# Patient Record
Sex: Male | Born: 1975 | Race: White | Hispanic: No | Marital: Married | Smoking: Current every day smoker
Health system: Southern US, Community
[De-identification: ages and names within clinical notes are randomized; demographics above are authoritative.]

## PROBLEM LIST (undated history)

## (undated) DIAGNOSIS — I619 Nontraumatic intracerebral hemorrhage, unspecified: Secondary | ICD-10-CM

## (undated) DIAGNOSIS — I10 Essential (primary) hypertension: Secondary | ICD-10-CM

## (undated) HISTORY — DX: Nontraumatic intracerebral hemorrhage, unspecified: I61.9

---

## 2001-02-19 ENCOUNTER — Encounter: Payer: Self-pay | Admitting: Family Medicine

## 2001-02-19 ENCOUNTER — Ambulatory Visit (HOSPITAL_COMMUNITY): Admission: RE | Admit: 2001-02-19 | Discharge: 2001-02-19 | Payer: Self-pay | Admitting: Family Medicine

## 2001-08-17 ENCOUNTER — Encounter: Payer: Self-pay | Admitting: Family Medicine

## 2001-08-17 ENCOUNTER — Ambulatory Visit (HOSPITAL_COMMUNITY): Admission: RE | Admit: 2001-08-17 | Discharge: 2001-08-17 | Payer: Self-pay | Admitting: Family Medicine

## 2001-08-18 ENCOUNTER — Emergency Department (HOSPITAL_COMMUNITY): Admission: EM | Admit: 2001-08-18 | Discharge: 2001-08-18 | Payer: Self-pay | Admitting: Emergency Medicine

## 2001-08-19 ENCOUNTER — Encounter: Payer: Self-pay | Admitting: Emergency Medicine

## 2001-08-20 ENCOUNTER — Observation Stay (HOSPITAL_COMMUNITY): Admission: EM | Admit: 2001-08-20 | Discharge: 2001-08-20 | Payer: Self-pay | Admitting: Emergency Medicine

## 2001-12-29 ENCOUNTER — Emergency Department (HOSPITAL_COMMUNITY): Admission: EM | Admit: 2001-12-29 | Discharge: 2001-12-29 | Payer: Self-pay | Admitting: Emergency Medicine

## 2011-07-31 ENCOUNTER — Emergency Department (HOSPITAL_COMMUNITY): Payer: Self-pay

## 2011-07-31 ENCOUNTER — Emergency Department (HOSPITAL_COMMUNITY)
Admission: EM | Admit: 2011-07-31 | Discharge: 2011-07-31 | Disposition: A | Discharge status: Home / Self Care | Payer: Self-pay | Attending: Emergency Medicine | Admitting: Emergency Medicine

## 2011-07-31 ENCOUNTER — Encounter (HOSPITAL_COMMUNITY): Payer: Self-pay | Admitting: Emergency Medicine

## 2011-07-31 DIAGNOSIS — Y9375 Activity, martial arts: Secondary | ICD-10-CM | POA: Insufficient documentation

## 2011-07-31 DIAGNOSIS — R0789 Other chest pain: Secondary | ICD-10-CM

## 2011-07-31 DIAGNOSIS — R222 Localized swelling, mass and lump, trunk: Secondary | ICD-10-CM | POA: Insufficient documentation

## 2011-07-31 DIAGNOSIS — M549 Dorsalgia, unspecified: Secondary | ICD-10-CM | POA: Insufficient documentation

## 2011-07-31 DIAGNOSIS — R071 Chest pain on breathing: Secondary | ICD-10-CM | POA: Insufficient documentation

## 2011-07-31 DIAGNOSIS — W1801XA Striking against sports equipment with subsequent fall, initial encounter: Secondary | ICD-10-CM | POA: Insufficient documentation

## 2011-07-31 DIAGNOSIS — R05 Cough: Secondary | ICD-10-CM | POA: Insufficient documentation

## 2011-07-31 DIAGNOSIS — R059 Cough, unspecified: Secondary | ICD-10-CM | POA: Insufficient documentation

## 2011-07-31 DIAGNOSIS — S298XXA Other specified injuries of thorax, initial encounter: Secondary | ICD-10-CM | POA: Insufficient documentation

## 2011-07-31 DIAGNOSIS — S20219A Contusion of unspecified front wall of thorax, initial encounter: Secondary | ICD-10-CM | POA: Insufficient documentation

## 2011-07-31 MED ORDER — HYDROCODONE-ACETAMINOPHEN 5-325 MG PO TABS: 1.0000 | ORAL_TABLET | Freq: Four times a day (QID) | ORAL | Status: AC | PRN

## 2011-07-31 MED ORDER — HYDROCODONE-ACETAMINOPHEN 5-325 MG PO TABS
1.0000 | ORAL_TABLET | Freq: Once | ORAL | Status: AC
  Administered 2011-07-31: 1 via ORAL
  Filled 2011-07-31: qty 1

## 2011-07-31 MED ORDER — HYDROCODONE-ACETAMINOPHEN 5-325 MG PO TABS: 1.0000 | ORAL_TABLET | Freq: Four times a day (QID) | ORAL | Status: DC | PRN

## 2011-07-31 NOTE — ED Provider Notes (Signed)
History     CSN: [id number]  Arrival date & time 07/31/11  1209   First MD Initiated Contact with Patient 07/31/11 1318      Chief Complaint  Patient presents with  . Back Pain  . Rib Injury    1 week hx rib pain on l/side. Trauma was sports related    (Consider location/radiation/quality/duration/timing/severity/associated sxs/prior treatment) HPI Comments: Patient states he was doing mixed martial arts 2 weeks ago and fell backwards with someone in a headlock, with the person's head landing on his ribs.  Reports pain, swelling, bruising since then.  Has been using ice, heat, and ibuprofen.  Pain has since gotten worse, he has developed a cough.  Denies fevers, SOB.    Patient is a 36 y.o. male presenting with back pain. The history is provided by the patient.  Back Pain  Associated symptoms include chest pain. Pertinent negatives include no fever and no abdominal pain.    History reviewed. No pertinent past medical history.  History reviewed. No pertinent past surgical history.  History reviewed. No pertinent family history.  History  Substance Use Topics  . Smoking status: Current Everyday Smoker    Types: Cigarettes  . Smokeless tobacco: Not on file  . Alcohol Use: Yes      Review of Systems  Constitutional: Negative for fever.  Respiratory: Positive for cough. Negative for shortness of breath, wheezing and stridor.   Cardiovascular: Positive for chest pain.  Gastrointestinal: Negative for vomiting and abdominal pain.  Musculoskeletal: Positive for back pain.    Allergies  Review of patient's allergies indicates no known allergies.  Home Medications   Current Outpatient Rx  Name Route Sig Dispense Refill  . IBUPROFEN 200 MG PO TABS Oral Take 200 mg by mouth every 6 (six) hours as needed. For pain    . ADULT MULTIVITAMIN W/MINERALS CH Oral Take 1 tablet by mouth daily.      BP 115/66  Pulse 77  Temp(Src) 98.3 F (36.8 C) (Oral)  SpO2 97%  Physical  Exam  Nursing note and vitals reviewed. Constitutional: He is oriented to person, place, and time. He appears well-developed and well-nourished.  HENT:  Head: Normocephalic and atraumatic.  Neck: Neck supple.  Cardiovascular: Normal rate and regular rhythm.   Pulmonary/Chest: Effort normal and breath sounds normal. No respiratory distress. He has no wheezes. He has no rales. He exhibits tenderness.    Neurological: He is alert and oriented to person, place, and time.  Psychiatric: He has a normal mood and affect. His behavior is normal. Judgment and thought content normal.    ED Course  Procedures (including critical care time)  Labs Reviewed - No data to display Dg Ribs Unilateral W/chest Left  07/31/2011  *RADIOLOGY REPORT*  Clinical Data: Injury to left anterior ribs 2 weeks ago, increasing pain, cough  LEFT RIBS AND CHEST - 3+ VIEW  Comparison: None  Findings: Normal heart size, mediastinal contours, and pulmonary vascularity. Lungs clear. No pleural effusion or pneumothorax. No rib fracture or bone destruction identified. Osseous mineralization normal.  IMPRESSION: No acute left rib abnormalities.  Original Report Authenticated By: Lollie Marrow, M.D.     1. Chest wall pain       MDM  Patient is a very athletic, healthy person who sustained an injury to left chest wall while doing mixed martial arts two weeks ago.  From history, it is unclear whether this was a contusion to the chest or a strained muscle (or both).  CXR is negative for fracture and for pneumonia.  Discussed home care and return precautions with patient.  D/C home with pain medications, resources for PCP follow up.  Patient verbalizes understanding and agrees with plan.          Dillard Cannon He[place], [place] 07/31/11 1404

## 2011-07-31 NOTE — Progress Notes (Signed)
ED CM spoke with pt who confirms he is self pay with no pcp. States his job recently dropped his insurance coverage.  CM discussed and provided pt with a list of resources, self pay pcps, health connect and medication assistance resources for [place] self pay residents Pt and family member voiced appreciation and understanding of resources offered.

## 2011-07-31 NOTE — Discharge Instructions (Signed)
Read the information below.  Make sure to take deep breaths and cough throughout the day to prevent pneumonia.  Use the resources provided to find a primary care provider for follow up.  Continue to use the ibuprofen as we discussed.  Do not take additional tylenol while taking the prescribed pain medication.  You may return to the ER at any time for worsening condition or any new symptoms that concern you.   Chest Wall Pain Chest wall pain is pain in or around the bones and muscles of your chest. It may take up to 6 weeks to get better. It may take longer if you must stay physically active in your work and activities.  CAUSES  Chest wall pain may happen on its own. However, it may be caused by:  A viral illness like the flu.   Injury.   Coughing.   Exercise.   Arthritis.   Fibromyalgia.   Shingles.  HOME CARE INSTRUCTIONS   Avoid overtiring physical activity. Try not to strain or perform activities that cause pain. This includes any activities using your chest or your abdominal and side muscles, especially if heavy weights are used.   Put ice on the sore area.   Put ice in a plastic bag.   Place a towel between your skin and the bag.   Leave the ice on for 15 to 20 minutes per hour while awake for the first 2 days.   Only take over-the-counter or prescription medicines for pain, discomfort, or fever as directed by your caregiver.  SEEK IMMEDIATE MEDICAL CARE IF:   Your pain increases, or you are very uncomfortable.   You have a fever.   Your chest pain becomes worse.   You have new, unexplained symptoms.   You have nausea or vomiting.   You feel sweaty or lightheaded.   You have a cough with phlegm (sputum), or you cough up blood.  MAKE SURE YOU:   Understand these instructions.   Will watch your condition.   Will get help right away if you are not doing well or get worse.  Document Released: 04/14/2005 Document Revised: 04/03/2011 Document Reviewed:  12/09/2010 [facility] Patient Information 2012 [place], [place].

## 2011-07-31 NOTE — ED Notes (Signed)
Pt was wrestling at gym and felt a popping sensation on l/rib area

## 2011-08-03 NOTE — ED Provider Notes (Signed)
Medical screening examination/treatment/procedure(s) were performed by non-physician practitioner and as supervising physician I was immediately available for consultation/collaboration.   Gavin Pound. Oletta Lamas, MD 08/03/11 [phone]

## 2017-07-31 ENCOUNTER — Other Ambulatory Visit: Payer: Self-pay

## 2017-07-31 ENCOUNTER — Emergency Department (HOSPITAL_COMMUNITY): Payer: 59

## 2017-07-31 ENCOUNTER — Emergency Department (HOSPITAL_COMMUNITY)
Admission: EM | Admit: 2017-07-31 | Discharge: 2017-07-31 | Disposition: A | Discharge status: Home / Self Care | Payer: 59 | Attending: Emergency Medicine | Admitting: Emergency Medicine

## 2017-07-31 DIAGNOSIS — F17210 Nicotine dependence, cigarettes, uncomplicated: Secondary | ICD-10-CM | POA: Insufficient documentation

## 2017-07-31 DIAGNOSIS — Z79899 Other long term (current) drug therapy: Secondary | ICD-10-CM | POA: Diagnosis not present

## 2017-07-31 DIAGNOSIS — R42 Dizziness and giddiness: Secondary | ICD-10-CM | POA: Diagnosis present

## 2017-07-31 DIAGNOSIS — R569 Unspecified convulsions: Secondary | ICD-10-CM

## 2017-07-31 DIAGNOSIS — G40209 Localization-related (focal) (partial) symptomatic epilepsy and epileptic syndromes with complex partial seizures, not intractable, without status epilepticus: Secondary | ICD-10-CM | POA: Diagnosis not present

## 2017-07-31 LAB — DIFFERENTIAL
BASOS ABS: 0 10*3/uL (ref 0.0–0.1)
BASOS PCT: 0 %
Eosinophils Absolute: 0.1 10*3/uL (ref 0.0–0.7)
Eosinophils Relative: 1 %
Lymphocytes Relative: 14 %
Lymphs Abs: 1.6 10*3/uL (ref 0.7–4.0)
MONO ABS: 0.7 10*3/uL (ref 0.1–1.0)
Monocytes Relative: 7 %
Neutro Abs: 8.7 10*3/uL — ABNORMAL HIGH (ref 1.7–7.7)
Neutrophils Relative %: 78 %

## 2017-07-31 LAB — CBC
HCT: 51.5 % (ref 39.0–52.0)
Hemoglobin: 17.3 g/dL — ABNORMAL HIGH (ref 13.0–17.0)
MCH: 31.1 pg (ref 26.0–34.0)
MCHC: 33.6 g/dL (ref 30.0–36.0)
MCV: 92.6 fL (ref 78.0–100.0)
PLATELETS: 381 10*3/uL (ref 150–400)
RBC: 5.56 MIL/uL (ref 4.22–5.81)
RDW: 14.8 % (ref 11.5–15.5)
WBC: 11.1 10*3/uL — AB (ref 4.0–10.5)

## 2017-07-31 LAB — COMPREHENSIVE METABOLIC PANEL
ALT: 91 U/L — AB (ref 17–63)
AST: 58 U/L — AB (ref 15–41)
Albumin: 3.9 g/dL (ref 3.5–5.0)
Alkaline Phosphatase: 32 U/L — ABNORMAL LOW (ref 38–126)
Anion gap: 7 (ref 5–15)
BUN: 18 mg/dL (ref 6–20)
CHLORIDE: 103 mmol/L (ref 101–111)
CO2: 25 mmol/L (ref 22–32)
CREATININE: 1.27 mg/dL — AB (ref 0.61–1.24)
Calcium: 9.4 mg/dL (ref 8.9–10.3)
GFR calc non Af Amer: >60 mL/min (ref >60)
Glucose, Bld: 107 mg/dL — ABNORMAL HIGH (ref 65–99)
POTASSIUM: 4.6 mmol/L (ref 3.5–5.1)
SODIUM: 135 mmol/L (ref 135–145)
Total Bilirubin: 1.1 mg/dL (ref 0.3–1.2)
Total Protein: 6.9 g/dL (ref 6.5–8.1)

## 2017-07-31 LAB — I-STAT CHEM 8, ED
BUN: 19 mg/dL (ref 6–20)
CHLORIDE: 103 mmol/L (ref 101–111)
Calcium, Ion: 1.18 mmol/L (ref 1.15–1.40)
Creatinine, Ser: 1.3 mg/dL — ABNORMAL HIGH (ref 0.61–1.24)
GLUCOSE: 106 mg/dL — AB (ref 65–99)
HEMATOCRIT: 54 % — AB (ref 39.0–52.0)
Hemoglobin: 18.4 g/dL — ABNORMAL HIGH (ref 13.0–17.0)
POTASSIUM: 4.7 mmol/L (ref 3.5–5.1)
Sodium: 140 mmol/L (ref 135–145)
TCO2: 27 mmol/L (ref 22–32)

## 2017-07-31 LAB — PROTIME-INR
INR: 1
PROTHROMBIN TIME: 13.1 s (ref 11.4–15.2)

## 2017-07-31 LAB — APTT: APTT: 27 s (ref 24–36)

## 2017-07-31 LAB — I-STAT TROPONIN, ED: Troponin i, poc: 0 ng/mL (ref 0.00–0.08)

## 2017-07-31 LAB — CBG MONITORING, ED: GLUCOSE-CAPILLARY: 107 mg/dL — AB (ref 65–99)

## 2017-07-31 MED ORDER — LEVETIRACETAM 500 MG PO TABS: 500.0000 mg | ORAL_TABLET | Freq: Two times a day (BID) | ORAL | 1 refills | Status: DC

## 2017-07-31 MED ORDER — ACETAMINOPHEN 500 MG PO TABS
1000.0000 mg | ORAL_TABLET | Freq: Once | ORAL | Status: AC
  Administered 2017-07-31: 1000 mg via ORAL
  Filled 2017-07-31: qty 2

## 2017-07-31 MED ORDER — LEVETIRACETAM IN NACL 1000 MG/100ML IV SOLN
1000.0000 mg | Freq: Once | INTRAVENOUS | Status: AC
  Administered 2017-07-31: 1000 mg via INTRAVENOUS
  Filled 2017-07-31: qty 100

## 2017-07-31 NOTE — ED Notes (Signed)
Medical Records Form fax @ 1050-per Dr. Claris PongMiller-fax by Marylene LandAngela

## 2017-07-31 NOTE — ED Notes (Signed)
"  Dr Hyacinth MeekerMiller assessing pt.

## 2017-07-31 NOTE — ED Notes (Signed)
Medical Record form fax @ 1150-per Dr. Claris PongMiller-fax [facility]

## 2017-07-31 NOTE — ED Triage Notes (Signed)
Pt states woke up this am at 0530 and felt fine.  At 0610 began to feel dizzy and felt his entire R side become heavy and numb.  States now feels "spacey" and some continued R sided numbness.  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

## 2017-07-31 NOTE — Discharge Instructions (Signed)
Your testing today showed that you have likely had a focal seizure.  This means that you will need to start taking seizure medication.  Please take Keppra, 500 mg by mouth twice a day, you will need to follow-up with your neurologist in [place] within the next 2 weeks.  Please do not drive, not only is this a safety issue but it is also a legal matter and you are not legally allowed to drive until cleared by your neurologist.  Additionally please do not swim as this could cause life-threatening injury or even death if you had a seizure while swimming.  Please eat and drink a normal diet, avoid stimulant use such as cough and cold medication alcohol tobacco or drugs of abuse.  Return to the emergency department for ongoing seizures or severe or worsening symptoms.

## 2017-07-31 NOTE — ED Notes (Signed)
CBG: 107. RN, Steward DroneBrenda and Dr. Hyacinth MeekerMiller notified.

## 2017-07-31 NOTE — Consult Note (Signed)
Neurology Consultation Reason for Consult: Right-sided abnormality Referring Physician: Hyacinth MeekerMiller, B  CC: Right-sided weakness  History is obtained from: Patient  HPI: George KraussRyan S Burke is a 42 y.o. male who is feeling well after he woke up and then had sudden onset flashing lights in the right visual field, as well as shocklike sensation of his right hand and foot he has had down for a few minutes and the symptoms passed.  He then went on to his car where he had right leg jerking lasting for about 5 minutes.    LKW: 6:10 tpa given?: no, not a stroke    ROS: A 14 point ROS was performed and is negative except as noted in the HPI.   Past medical history: Stroke: uncertain if hemorrhagic infarct versus IPH Hypertension  Family history: No history of similar  Social History:  reports that he has been smoking cigarettes.  He does not have any smokeless tobacco history on file. He reports that he drinks alcohol. His drug history is not on file.   Exam: Current vital signs: BP (!) 149/84   Pulse 73   Temp 98.7 F (37.1 C)   Resp (!) 21   Ht 5\' 11"  (1.803 m)   Wt 107 kg (235 lb 14.3 oz)   SpO2 96%   BMI 32.90 kg/m  Vital signs in last 24 hours: Temp:  [98.7 F (37.1 C)] 98.7 F (37.1 C) (04/05 1048) Pulse Rate:  [73-76] 73 (04/05 1015) Resp:  [16-21] 21 (04/05 1015) BP: (148-149)/(76-84) 149/84 (04/05 1015) SpO2:  [96 %-98 %] 96 % (04/05 1015) Weight:  [107 kg (235 lb 14.3 oz)] 107 kg (235 lb 14.3 oz) (04/05 0951)   Physical Exam  Constitutional: Appears well-developed and well-nourished.  Psych: Affect appropriate to situation Eyes: No scleral injection HENT: No OP obstrucion Head: Normocephalic.  Cardiovascular: Normal rate and regular rhythm.  Respiratory: Effort normal, non-labored breathing GI: Soft.  No distension. There is no tenderness.  Skin: WDI  Neuro: Mental Status: Patient is awake, alert, oriented to person, place, month, year, and situation. Patient  is able to give a clear and coherent history. No signs of aphasia or neglect Cranial Nerves: II: Visual Fields are full. Pupils are equal, round, and reactive to light.   III,IV, VI: EOMI without ptosis or diploplia.  V: Facial sensation is symmetric to temperature VII: Facial movement with mild flattening of the right nasolabial fold VIII: hearing is intact to voice X: Uvula elevates symmetrically XI: Shoulder shrug is symmetric. XII: tongue is midline without atrophy or fasciculations.  Motor: Tone is normal. Bulk is normal. 5/5 strength was present in all four extremities.  Sensory: Sensation is symmetric to light touch and temperature in the arms and legs. Cerebellar: No clear ataxia   I have reviewed labs in epic and the results pertinent to this consultation are: Borderline creatinine  I have reviewed the images obtained: CT head-negative  Impression: 42 year old male with new onset episode consistent with partial seizure.  Positive symptoms (flashing lights, shocklike sensations, clonic movements) would argue strongly against TIA as an etiology.  The clonic movements are not typical for migraine aura.  He has a very clear likely seizure focus in the area of previous hemorrhage.  At this time, I feel that we can call this a partial seizure, even though it is his first with his clear focus I would favor starting antiepileptic therapy.  Recommendations: 1) Keppra 500 mg's twice daily 2) follow-up with neurology as an  outpatient.  Ritta Slot, MD [facility] [phone]  If 7pm- 7am, please page neurology on call as listed in AMION.

## 2017-07-31 NOTE — ED Provider Notes (Signed)
[facility] EMERGENCY DEPARTMENT Provider Note   CSN: [id number] Arrival date & time: 07/31/17  0920     History   Chief Complaint Chief Complaint  Patient presents with  . Dizziness    HPI George Burke is a 42 y.o. male.  HPI  The patient is a 42 year old male, he has a history of hypertension as well as a history of a stroke last year, he is currently taking antihypertensives, drinks occasionally, denies other drugs.  Chief complaint of right-sided weakness.  The patient states that this morning he woke up and everything was normal, he went to the bathroom to urinate, that was okay but a short time later at approximately 6:10 AM he developed acute onset of right-sided weakness and numbness and tingling.  He had difficulty walking and felt like he needed to sit down.  He sat down for 20 minutes, felt a little better and got up.  Since that time the weakness has resolved but his numbness has persisted.  There is no changes in speech or vision.  Symptoms were severe, similar to prior stroke, have improved spontaneously but is still present.  Code stroke was activated on the patient's arrival.  He does complain of some lightheadedness and a slight right-sided headache  No past medical history on file.  There are no active problems to display for this patient.   No past surgical history on file.      Home Medications    Prior to Admission medications   Medication Sig Start Date End Date Taking? Authorizing Provider  amitriptyline (ELAVIL) 50 MG tablet Take 50 mg by mouth at bedtime.   Yes [provider]  amLODipine (NORVASC) 10 MG tablet Take 10 mg by mouth daily.   Yes [provider]  cloNIDine (CATAPRES) 0.2 MG tablet Take 0.2 mg by mouth 3 (three) times daily.   Yes [provider]  ibuprofen (ADVIL,MOTRIN) 200 MG tablet Take 200 mg by mouth every 6 (six) hours as needed. For pain   Yes [provider]  metoprolol  succinate (TOPROL-XL) 25 MG 24 hr tablet Take 25 mg by mouth daily.   Yes [provider]  traZODone (DESYREL) 50 MG tablet Take 25 mg by mouth at bedtime.   Yes [provider]  levETIRAcetam (KEPPRA) 500 MG tablet Take 1 tablet (500 mg total) by mouth 2 (two) times daily. 07/31/17 08/30/17  Eber Hong, MD    Family History No family history on file.  Social History Social History   Tobacco Use  . Smoking status: Current Every Day Smoker    Types: Cigarettes  Substance Use Topics  . Alcohol use: Yes  . Drug use: Not on file     Allergies   Patient has no known allergies.   Review of Systems Review of Systems  All other systems reviewed and are negative.    Physical Exam Updated Vital Signs BP (!) 118/44   Pulse 66   Temp 98.7 F (37.1 C)   Resp 17   Ht 5\' 11"  (1.803 m)   Wt 107 kg (235 lb 14.3 oz)   SpO2 95%   BMI 32.90 kg/m   Physical Exam  Constitutional: He appears well-developed and well-nourished. No distress.  HENT:  Head: Normocephalic and atraumatic.  Mouth/Throat: Oropharynx is clear and moist. No oropharyngeal exudate.  Eyes: Pupils are equal, round, and reactive to light. Conjunctivae and EOM are normal. Right eye exhibits no discharge. Left eye exhibits no discharge.  No scleral icterus.  Neck: Normal range of motion. Neck supple. No JVD present. No thyromegaly present.  Cardiovascular: Normal rate, regular rhythm, normal heart sounds and intact distal pulses. Exam reveals no gallop and no friction rub.  No murmur heard. Pulmonary/Chest: Effort normal and breath sounds normal. No respiratory distress. He has no wheezes. He has no rales.  Abdominal: Soft. Bowel sounds are normal. He exhibits no distension and no mass. There is no tenderness.  Musculoskeletal: Normal range of motion. He exhibits no edema or tenderness.  Lymphadenopathy:    He has no cervical adenopathy.  Neurological: He is alert. Coordination normal.  Speech is  clear, cranial nerves III through XII are intact, memory is intact, strength is normal in all 4 extremities including grips, sensation is intact to light touch and pinprick in all 4 extremities, except for the right upper and right lower extremity where there is some unilateral sensory deficits stocking glove. Coordination as tested by finger-nose-finger is normal, no limb ataxia. Normal gait, normal reflexes at the patellar tendons bilaterally  Skin: Skin is warm and dry. No rash noted. No erythema.  Psychiatric: He has a normal mood and affect. His behavior is normal.  Nursing note and vitals reviewed.    ED Treatments / Results  Labs (all labs ordered are listed, but only abnormal results are displayed) Labs Reviewed  CBC - Abnormal; Notable for the following components:      Result Value   WBC 11.1 (*)    Hemoglobin 17.3 (*)    All other components within normal limits  DIFFERENTIAL - Abnormal; Notable for the following components:   Neutro Abs 8.7 (*)    All other components within normal limits  COMPREHENSIVE METABOLIC PANEL - Abnormal; Notable for the following components:   Glucose, Bld 107 (*)    Creatinine, Ser 1.27 (*)    AST 58 (*)    ALT 91 (*)    Alkaline Phosphatase 32 (*)    All other components within normal limits  CBG MONITORING, ED - Abnormal; Notable for the following components:   Glucose-Capillary 107 (*)    All other components within normal limits  I-STAT CHEM 8, ED - Abnormal; Notable for the following components:   Creatinine, Ser 1.30 (*)    Glucose, Bld 106 (*)    Hemoglobin 18.4 (*)    HCT 54.0 (*)    All other components within normal limits  PROTIME-INR  APTT  I-STAT TROPONIN, ED  CBG MONITORING, ED    EKG EKG Interpretation  Date/Time:  Friday July 31 2017 09:35:34 EDT Ventricular Rate:  82 PR Interval:  154 QRS Duration: 86 QT Interval:  346 QTC Calculation: 404 R Axis:   34 Text Interpretation:  Normal sinus rhythm Normal ECG No  old tracing to compare Confirmed by Eber HongMiller, Tanetta Fuhriman ([id number]) on 07/31/2017 10:05:53 AM   Radiology Mr Brain Wo Contrast  Result Date: 07/31/2017 CLINICAL DATA:  42 year old male with abrupt onset dizziness and right side weakness and numbness beginning at 0610 hours. Right side numbness persists. EXAM: MRI HEAD WITHOUT CONTRAST TECHNIQUE: Multiplanar, multiecho pulse sequences of the brain and surrounding structures were obtained without intravenous contrast. COMPARISON:  Head CT without contrast 0952 hours today. FINDINGS: Brain: Confluent hemosiderin and encephalomalacia in the posterior left cingulate gyrus and medial right parietal lobe corresponding to the chronic appearing finding on the noncontrast head CT today. The changes abut the left lateral ventricle with mild ex vacuo enlargement. There is mild associated gliosis (series  8, image 18). No chronic cerebral blood products identified elsewhere. Susceptibility artifact in the medial left parietal lobe on diffusion-weighted imaging but no restricted diffusion or evidence of acute infarction. Wallace Cullens and white matter signal elsewhere is within normal limits. No midline shift, mass effect, evidence of mass lesion, ventriculomegaly, extra-axial collection or acute intracranial hemorrhage. Cervicomedullary junction and pituitary are within normal limits. Vascular: Major intracranial vascular flow voids are preserved. Skull and upper cervical spine: Negative visible cervical spine. Normal bone marrow signal. Sinuses/Orbits: Normal orbits soft tissues. Mild maxillary sinus alveolar recess mucosal thickening. Other: Visible internal auditory structures appear normal. Mastoid air cells are clear. 10 mm round T2 hyperintense area in the superficial right parotid gland on series 11, image 26. Questionable associated abnormal diffusion. Other scalp and face soft tissues appear negative. IMPRESSION: 1. 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 2. No superimposed acute infarct. No other chronic cerebral blood products identified. Elsewhere normal noncontrast MRI appearance of the brain. 3. Small nonspecific 10 mm nodule in the right parotid gland. Recommend outpatient ENT follow-up. Electronically Signed   By: Odessa Fleming M.D.   On: 07/31/2017 12:26   Ct Head Code Stroke Wo Contrast  Result Date: 07/31/2017 CLINICAL DATA:  Code stroke. Sudden onset dizziness. Suspect cerebral hemorrhage EXAM: CT HEAD WITHOUT CONTRAST TECHNIQUE: Contiguous axial images were obtained from the base of the skull through the vertex without intravenous contrast. COMPARISON:  None. FINDINGS: Brain: Encephalomalacia in the left medial parietal lobe compatible with chronic ischemia. Negative for acute infarct. Negative for hemorrhage or mass. Ventricle size normal.  No fluid collection or midline shift. Vascular: Negative for hyperdense vessel Skull: Negative Sinuses/Orbits: Negative Other: None ASPECTS (Alberta Stroke Program Early CT Score) - Ganglionic level infarction (caudate, lentiform nuclei, internal capsule, insula, M1-M3 cortex): 7 - Supraganglionic infarction (M4-M6 cortex): 3 Total score (0-10 with 10 being normal): 10 IMPRESSION: 1. No acute intracranial abnormality. Chronic infarct left medial parietal lobe 2. ASPECTS is 10 3. These results were called by telephone at the time of interpretation on 07/31/2017 at 10:00 am to Dr. Amada Jupiter , who verbally acknowledged these results. Electronically Signed   By: Marlan Palau M.D.   On: 07/31/2017 10:00    Procedures Procedures (including critical care time)  Medications Ordered in ED Medications  levETIRAcetam (KEPPRA) IVPB 1000 mg/100 mL premix (0 mg Intravenous Stopped 07/31/17 1055)  acetaminophen (TYLENOL) tablet 1,000 mg (1,000 mg Oral Given 07/31/17 1049)     Initial Impression / Assessment and Plan / ED Course  I have reviewed  the triage vital signs and the nursing notes.  Pertinent labs & imaging results that were available during my care of the patient were reviewed by me and considered in my medical decision making (see chart for details).  Clinical Course as of Aug 01 1438  Fri Jul 31, 2017  1013 The patient has been seen by neurology, they think this may be a focal seizure given that the patient now states he was having some shaking of his leg.  This after seeing bright lights.  Will obtain MRI of the brain, obtained prior records, patient states this was done at [facility].   [BM]    Clinical Course User Index [BM] Eber Hong, MD    The patient appears to have some lateralizing symptoms which are subjective at this point and sensory, there is no motor findings, code stroke was activated.  Discussed results of the MRI with the patient and his spouse as well as with the neurologist.  The patient endorses that he had some sparkling lights in his vision prior to this occurring and then had some shaking of his right lower extremity while he was on the ground.  This is all consistent with his prior stroke and having a focus for partial seizure activity.  He had no further seizure activity in the emergency department and states he is back to normal.  The patient will be stable for discharge on Keppra and according to the neurologist can follow-up in the outpatient setting.  I feel that this is a appropriate disposition and the patient is willing to follow-up as stated.   Kai Levins given in the ED   Final Clinical Impressions(s) / ED Diagnoses   Final diagnoses:  Focal seizure [facility])    ED Discharge Orders        Ordered    levETIRAcetam (KEPPRA) 500 MG tablet  2 times daily     07/31/17 1438       Eber Hong, MD 07/31/17 1440

## 2019-05-10 ENCOUNTER — Emergency Department (HOSPITAL_COMMUNITY)
Admission: EM | Admit: 2019-05-10 | Discharge: 2019-05-10 | Discharge status: Against Medical Advice | Payer: 59 | Attending: Emergency Medicine | Admitting: Emergency Medicine

## 2019-05-10 ENCOUNTER — Encounter (HOSPITAL_COMMUNITY): Payer: Self-pay | Admitting: Emergency Medicine

## 2019-05-10 ENCOUNTER — Other Ambulatory Visit: Payer: Self-pay

## 2019-05-10 DIAGNOSIS — Z20822 Contact with and (suspected) exposure to covid-19: Secondary | ICD-10-CM | POA: Insufficient documentation

## 2019-05-10 DIAGNOSIS — Z5321 Procedure and treatment not carried out due to patient leaving prior to being seen by health care provider: Secondary | ICD-10-CM | POA: Insufficient documentation

## 2019-05-10 HISTORY — DX: Essential (primary) hypertension: I10

## 2019-05-10 LAB — COMPREHENSIVE METABOLIC PANEL
ALT: 31 U/L (ref 0–44)
AST: 27 U/L (ref 15–41)
Albumin: 4 g/dL (ref 3.5–5.0)
Alkaline Phosphatase: 74 U/L (ref 38–126)
Anion gap: 9 (ref 5–15)
BUN: 17 mg/dL (ref 6–20)
CO2: 23 mmol/L (ref 22–32)
Calcium: 9.1 mg/dL (ref 8.9–10.3)
Chloride: 105 mmol/L (ref 98–111)
Creatinine, Ser: 1.87 mg/dL — ABNORMAL HIGH (ref 0.61–1.24)
GFR calc Af Amer: 51 mL/min — ABNORMAL LOW (ref >60)
GFR calc non Af Amer: 44 mL/min — ABNORMAL LOW (ref >60)
Glucose, Bld: 114 mg/dL — ABNORMAL HIGH (ref 70–99)
Potassium: 3.9 mmol/L (ref 3.5–5.1)
Sodium: 137 mmol/L (ref 135–145)
Total Bilirubin: 1.4 mg/dL — ABNORMAL HIGH (ref 0.3–1.2)
Total Protein: 7.1 g/dL (ref 6.5–8.1)

## 2019-05-10 LAB — LACTIC ACID, PLASMA: Lactic Acid, Venous: 1 mmol/L (ref 0.5–1.9)

## 2019-05-10 LAB — CBC
HCT: 49.2 % (ref 39.0–52.0)
Hemoglobin: 16.5 g/dL (ref 13.0–17.0)
MCH: 31.1 pg (ref 26.0–34.0)
MCHC: 33.5 g/dL (ref 30.0–36.0)
MCV: 92.8 fL (ref 80.0–100.0)
Platelets: 265 10*3/uL (ref 150–400)
RBC: 5.3 MIL/uL (ref 4.22–5.81)
RDW: 12.1 % (ref 11.5–15.5)
WBC: 20.7 10*3/uL — ABNORMAL HIGH (ref 4.0–10.5)
nRBC: 0 % (ref 0.0–0.2)

## 2019-05-10 LAB — LIPASE, BLOOD: Lipase: 34 U/L (ref 11–51)

## 2019-05-10 LAB — POC SARS CORONAVIRUS 2 AG -  ED: SARS Coronavirus 2 Ag: NEGATIVE

## 2019-05-10 MED ORDER — SODIUM CHLORIDE 0.9% FLUSH: 3.0000 mL | Freq: Once | INTRAVENOUS | Status: DC

## 2019-05-10 NOTE — ED Notes (Signed)
Pt name called x3 with no response. 

## 2019-05-10 NOTE — ED Notes (Signed)
George Burke (Mother#[phone]) called/would like a call back on his status/stated pt's called stated in pain.

## 2019-05-10 NOTE — ED Triage Notes (Signed)
Pt with lower back pain, malaise, and emesis x2 days. He also has had a fever intermittently. He is lethargic at triage speaking clear sentences.

## 2019-05-13 ENCOUNTER — Inpatient Hospital Stay (HOSPITAL_COMMUNITY)
Admission: EM | Admit: 2019-05-13 | Discharge: 2019-05-19 | DRG: 289 | Disposition: A | Discharge status: Home Health | Payer: Self-pay | Attending: Student in an Organized Health Care Education/Training Program | Admitting: Student in an Organized Health Care Education/Training Program

## 2019-05-13 ENCOUNTER — Other Ambulatory Visit: Payer: Self-pay

## 2019-05-13 ENCOUNTER — Telehealth (HOSPITAL_BASED_OUTPATIENT_CLINIC_OR_DEPARTMENT_OTHER): Payer: Self-pay | Admitting: Emergency Medicine

## 2019-05-13 ENCOUNTER — Emergency Department (HOSPITAL_COMMUNITY): Payer: Self-pay

## 2019-05-13 ENCOUNTER — Inpatient Hospital Stay (HOSPITAL_COMMUNITY): Payer: Self-pay

## 2019-05-13 DIAGNOSIS — I330 Acute and subacute infective endocarditis: Principal | ICD-10-CM | POA: Diagnosis present

## 2019-05-13 DIAGNOSIS — R7881 Bacteremia: Secondary | ICD-10-CM | POA: Diagnosis present

## 2019-05-13 DIAGNOSIS — R509 Fever, unspecified: Secondary | ICD-10-CM | POA: Diagnosis not present

## 2019-05-13 DIAGNOSIS — N189 Chronic kidney disease, unspecified: Secondary | ICD-10-CM | POA: Diagnosis present

## 2019-05-13 DIAGNOSIS — Z833 Family history of diabetes mellitus: Secondary | ICD-10-CM

## 2019-05-13 DIAGNOSIS — E861 Hypovolemia: Secondary | ICD-10-CM | POA: Diagnosis present

## 2019-05-13 DIAGNOSIS — K029 Dental caries, unspecified: Secondary | ICD-10-CM | POA: Diagnosis present

## 2019-05-13 DIAGNOSIS — Z20822 Contact with and (suspected) exposure to covid-19: Secondary | ICD-10-CM | POA: Diagnosis present

## 2019-05-13 DIAGNOSIS — I058 Other rheumatic mitral valve diseases: Secondary | ICD-10-CM

## 2019-05-13 DIAGNOSIS — M5117 Intervertebral disc disorders with radiculopathy, lumbosacral region: Secondary | ICD-10-CM | POA: Diagnosis present

## 2019-05-13 DIAGNOSIS — M5116 Intervertebral disc disorders with radiculopathy, lumbar region: Secondary | ICD-10-CM | POA: Diagnosis present

## 2019-05-13 DIAGNOSIS — G9520 Unspecified cord compression: Secondary | ICD-10-CM | POA: Diagnosis present

## 2019-05-13 DIAGNOSIS — B957 Other staphylococcus as the cause of diseases classified elsewhere: Secondary | ICD-10-CM | POA: Diagnosis present

## 2019-05-13 DIAGNOSIS — K5900 Constipation, unspecified: Secondary | ICD-10-CM | POA: Diagnosis present

## 2019-05-13 DIAGNOSIS — M5416 Radiculopathy, lumbar region: Secondary | ICD-10-CM

## 2019-05-13 DIAGNOSIS — I059 Rheumatic mitral valve disease, unspecified: Secondary | ICD-10-CM

## 2019-05-13 DIAGNOSIS — R945 Abnormal results of liver function studies: Secondary | ICD-10-CM

## 2019-05-13 DIAGNOSIS — G40909 Epilepsy, unspecified, not intractable, without status epilepticus: Secondary | ICD-10-CM | POA: Diagnosis present

## 2019-05-13 DIAGNOSIS — Z79899 Other long term (current) drug therapy: Secondary | ICD-10-CM

## 2019-05-13 DIAGNOSIS — Z8673 Personal history of transient ischemic attack (TIA), and cerebral infarction without residual deficits: Secondary | ICD-10-CM

## 2019-05-13 DIAGNOSIS — Z791 Long term (current) use of non-steroidal anti-inflammatories (NSAID): Secondary | ICD-10-CM

## 2019-05-13 DIAGNOSIS — M545 Low back pain: Secondary | ICD-10-CM

## 2019-05-13 DIAGNOSIS — I129 Hypertensive chronic kidney disease with stage 1 through stage 4 chronic kidney disease, or unspecified chronic kidney disease: Secondary | ICD-10-CM | POA: Diagnosis present

## 2019-05-13 DIAGNOSIS — N179 Acute kidney failure, unspecified: Secondary | ICD-10-CM | POA: Diagnosis present

## 2019-05-13 DIAGNOSIS — F17210 Nicotine dependence, cigarettes, uncomplicated: Secondary | ICD-10-CM | POA: Diagnosis present

## 2019-05-13 DIAGNOSIS — Z9049 Acquired absence of other specified parts of digestive tract: Secondary | ICD-10-CM

## 2019-05-13 DIAGNOSIS — Z87891 Personal history of nicotine dependence: Secondary | ICD-10-CM

## 2019-05-13 DIAGNOSIS — B958 Unspecified staphylococcus as the cause of diseases classified elsewhere: Secondary | ICD-10-CM

## 2019-05-13 DIAGNOSIS — R7989 Other specified abnormal findings of blood chemistry: Secondary | ICD-10-CM | POA: Diagnosis present

## 2019-05-13 HISTORY — DX: Other staphylococcus as the cause of diseases classified elsewhere: B95.7

## 2019-05-13 HISTORY — DX: Bacteremia: R78.81

## 2019-05-13 LAB — COMPREHENSIVE METABOLIC PANEL
ALT: 37 U/L (ref 0–44)
AST: 31 U/L (ref 15–41)
Albumin: 2.9 g/dL — ABNORMAL LOW (ref 3.5–5.0)
Alkaline Phosphatase: 84 U/L (ref 38–126)
Anion gap: 12 (ref 5–15)
BUN: 20 mg/dL (ref 6–20)
CO2: 21 mmol/L — ABNORMAL LOW (ref 22–32)
Calcium: 8.4 mg/dL — ABNORMAL LOW (ref 8.9–10.3)
Chloride: 105 mmol/L (ref 98–111)
Creatinine, Ser: 1.7 mg/dL — ABNORMAL HIGH (ref 0.61–1.24)
GFR calc Af Amer: 57 mL/min — ABNORMAL LOW (ref >60)
GFR calc non Af Amer: 49 mL/min — ABNORMAL LOW (ref >60)
Glucose, Bld: 105 mg/dL — ABNORMAL HIGH (ref 70–99)
Potassium: 3.5 mmol/L (ref 3.5–5.1)
Sodium: 138 mmol/L (ref 135–145)
Total Bilirubin: 1.2 mg/dL (ref 0.3–1.2)
Total Protein: 6.5 g/dL (ref 6.5–8.1)

## 2019-05-13 LAB — RAPID URINE DRUG SCREEN, HOSP PERFORMED
Amphetamines: NOT DETECTED
Barbiturates: NOT DETECTED
Benzodiazepines: POSITIVE — AB
Cocaine: POSITIVE — AB
Opiates: NOT DETECTED
Tetrahydrocannabinol: NOT DETECTED

## 2019-05-13 LAB — RESPIRATORY PANEL BY RT PCR (FLU A&B, COVID)
Influenza A by PCR: NEGATIVE
Influenza B by PCR: NEGATIVE
SARS Coronavirus 2 by RT PCR: NEGATIVE

## 2019-05-13 LAB — URINALYSIS, ROUTINE W REFLEX MICROSCOPIC
Bacteria, UA: NONE SEEN
Bilirubin Urine: NEGATIVE
Glucose, UA: NEGATIVE mg/dL
Hgb urine dipstick: NEGATIVE
Ketones, ur: NEGATIVE mg/dL
Leukocytes,Ua: NEGATIVE
Nitrite: NEGATIVE
Protein, ur: 30 mg/dL — AB
Specific Gravity, Urine: 1.012 (ref 1.005–1.030)
pH: 6 (ref 5.0–8.0)

## 2019-05-13 LAB — CBC WITH DIFFERENTIAL/PLATELET
Abs Immature Granulocytes: 0.1 10*3/uL — ABNORMAL HIGH (ref 0.00–0.07)
Basophils Absolute: 0 10*3/uL (ref 0.0–0.1)
Basophils Relative: 1 %
Eosinophils Absolute: 0.1 10*3/uL (ref 0.0–0.5)
Eosinophils Relative: 1 %
HCT: 45.3 % (ref 39.0–52.0)
Hemoglobin: 14.8 g/dL (ref 13.0–17.0)
Immature Granulocytes: 2 %
Lymphocytes Relative: 7 %
Lymphs Abs: 0.5 10*3/uL — ABNORMAL LOW (ref 0.7–4.0)
MCH: 30.7 pg (ref 26.0–34.0)
MCHC: 32.7 g/dL (ref 30.0–36.0)
MCV: 94 fL (ref 80.0–100.0)
Monocytes Absolute: 0.8 10*3/uL (ref 0.1–1.0)
Monocytes Relative: 12 %
Neutro Abs: 5.2 10*3/uL (ref 1.7–7.7)
Neutrophils Relative %: 77 %
Platelets: 227 10*3/uL (ref 150–400)
RBC: 4.82 MIL/uL (ref 4.22–5.81)
RDW: 12.8 % (ref 11.5–15.5)
WBC: 6.6 10*3/uL (ref 4.0–10.5)
nRBC: 0 % (ref 0.0–0.2)

## 2019-05-13 LAB — LACTIC ACID, PLASMA
Lactic Acid, Venous: 0.9 mmol/L (ref 0.5–1.9)
Lactic Acid, Venous: 1.1 mmol/L (ref 0.5–1.9)

## 2019-05-13 LAB — PROTIME-INR
INR: 1.1 (ref 0.8–1.2)
Prothrombin Time: 14.3 seconds (ref 11.4–15.2)

## 2019-05-13 LAB — LIPASE, BLOOD: Lipase: 13 U/L (ref 11–51)

## 2019-05-13 LAB — TROPONIN I (HIGH SENSITIVITY)
Troponin I (High Sensitivity): 4 ng/L (ref <18)
Troponin I (High Sensitivity): 5 ng/L (ref <18)

## 2019-05-13 LAB — ETHANOL: Alcohol, Ethyl (B): <10 mg/dL (ref <10)

## 2019-05-13 MED ORDER — VANCOMYCIN HCL 2000 MG/400ML IV SOLN
2000.0000 mg | Freq: Once | INTRAVENOUS | Status: AC
  Administered 2019-05-13: 13:00:00 2000 mg via INTRAVENOUS
  Filled 2019-05-13: qty 400

## 2019-05-13 MED ORDER — POLYETHYLENE GLYCOL 3350 17 G PO PACK
17.0000 g | PACK | Freq: Every day | ORAL | Status: DC
  Administered 2019-05-13: 17 g via ORAL
  Filled 2019-05-13: qty 1

## 2019-05-13 MED ORDER — AMLODIPINE BESYLATE 10 MG PO TABS
10.0000 mg | ORAL_TABLET | Freq: Every day | ORAL | Status: DC
  Administered 2019-05-13 – 2019-05-19 (×6): 10 mg via ORAL
  Filled 2019-05-13 (×6): qty 1

## 2019-05-13 MED ORDER — ENOXAPARIN SODIUM 40 MG/0.4ML ~~LOC~~ SOLN
40.0000 mg | SUBCUTANEOUS | Status: DC
  Administered 2019-05-13 – 2019-05-18 (×6): 40 mg via SUBCUTANEOUS
  Filled 2019-05-13 (×6): qty 0.4

## 2019-05-13 MED ORDER — SENNA 8.6 MG PO TABS
2.0000 | ORAL_TABLET | Freq: Two times a day (BID) | ORAL | Status: DC
  Administered 2019-05-13 – 2019-05-19 (×12): 17.2 mg via ORAL
  Filled 2019-05-13 (×12): qty 2

## 2019-05-13 MED ORDER — OXYCODONE HCL 5 MG PO TABS
5.0000 mg | ORAL_TABLET | ORAL | Status: DC | PRN
  Administered 2019-05-13 – 2019-05-15 (×10): 5 mg via ORAL
  Filled 2019-05-13 (×10): qty 1

## 2019-05-13 MED ORDER — VANCOMYCIN HCL 1750 MG/350ML IV SOLN
1750.0000 mg | INTRAVENOUS | Status: DC
  Administered 2019-05-14: 1750 mg via INTRAVENOUS
  Filled 2019-05-13 (×2): qty 350

## 2019-05-13 MED ORDER — MORPHINE SULFATE (PF) 4 MG/ML IV SOLN
4.0000 mg | Freq: Once | INTRAVENOUS | Status: AC
  Administered 2019-05-13: 12:00:00 4 mg via INTRAVENOUS
  Filled 2019-05-13: qty 1

## 2019-05-13 MED ORDER — ACETAMINOPHEN 650 MG RE SUPP: 650.0000 mg | Freq: Four times a day (QID) | RECTAL | Status: DC | PRN

## 2019-05-13 MED ORDER — ACETAMINOPHEN 500 MG PO TABS
1000.0000 mg | ORAL_TABLET | Freq: Once | ORAL | Status: AC
  Administered 2019-05-13: 12:00:00 1000 mg via ORAL
  Filled 2019-05-13: qty 2

## 2019-05-13 MED ORDER — ACETAMINOPHEN 325 MG PO TABS
650.0000 mg | ORAL_TABLET | Freq: Four times a day (QID) | ORAL | Status: DC | PRN
  Administered 2019-05-13 – 2019-05-14 (×3): 650 mg via ORAL
  Filled 2019-05-13 (×4): qty 2

## 2019-05-13 MED ORDER — TRAZODONE HCL 50 MG PO TABS
50.0000 mg | ORAL_TABLET | Freq: Every day | ORAL | Status: DC
  Administered 2019-05-13 – 2019-05-18 (×6): 50 mg via ORAL
  Filled 2019-05-13 (×6): qty 1

## 2019-05-13 MED ORDER — KETOROLAC TROMETHAMINE 30 MG/ML IJ SOLN: 30.0000 mg | Freq: Once | INTRAMUSCULAR | Status: DC

## 2019-05-13 MED ORDER — POLYETHYLENE GLYCOL 3350 17 G PO PACK: 17.0000 g | PACK | Freq: Every day | ORAL | Status: DC | PRN

## 2019-05-13 MED ORDER — HYDROCODONE-ACETAMINOPHEN 5-325 MG PO TABS
1.0000 | ORAL_TABLET | Freq: Once | ORAL | Status: AC
  Administered 2019-05-13: 15:00:00 1 via ORAL
  Filled 2019-05-13: qty 1

## 2019-05-13 MED ORDER — LACTATED RINGERS IV SOLN: INTRAVENOUS | Status: AC

## 2019-05-13 MED ORDER — LACTATED RINGERS IV BOLUS: 1000.0000 mL | Freq: Once | INTRAVENOUS | Status: DC

## 2019-05-13 MED ORDER — LEVETIRACETAM 500 MG PO TABS
500.0000 mg | ORAL_TABLET | Freq: Two times a day (BID) | ORAL | Status: DC
  Administered 2019-05-13 – 2019-05-19 (×11): 500 mg via ORAL
  Filled 2019-05-13 (×11): qty 1

## 2019-05-13 MED ORDER — GADOBUTROL 1 MMOL/ML IV SOLN
10.0000 mL | Freq: Once | INTRAVENOUS | Status: AC | PRN
  Administered 2019-05-13: 13:00:00 10 mL via INTRAVENOUS

## 2019-05-13 MED ORDER — PIPERACILLIN-TAZOBACTAM 3.375 G IVPB 30 MIN
3.3750 g | Freq: Once | INTRAVENOUS | Status: AC
  Administered 2019-05-13: 11:00:00 3.375 g via INTRAVENOUS
  Filled 2019-05-13: qty 50

## 2019-05-13 NOTE — Progress Notes (Signed)
Pt arrived to room 6N29 via wheelchair from the ED. Pt ambulatory without assistance. Received report from [employer], Charity fundraiser. See assessment. Will continue to monitor.

## 2019-05-13 NOTE — Progress Notes (Signed)
  Echocardiogram 2D Echocardiogram has been performed.  George Burke 05/13/2019, 5:37 PM

## 2019-05-13 NOTE — ED Provider Notes (Signed)
[facility] EMERGENCY DEPARTMENT Provider Note   CSN: [id number] Arrival date & time: 05/13/19  0854     History Chief Complaint  Patient presents with  . Abnormal Lab    TAELON BENDORF is a 44 y.o. male.  44 year old male with prior medical history as detailed below presents for evaluation of fever, low back pain, and positive blood cultures.  Patient was at [facility] briefly on January 12.  He left prior to being seen.  He did have cultures drawn at that time.  Cultures drawn on 1/12 were positive for gram-positive cocci in 2 out of 2 sets.   He returns today after phone contact from this facility advising him to return given his positive cultures.  He complains of continued fever and chills.  He complains of myalgias.  He complains specifically of low lumbar back pain without associated weakness.  He denies incontinence of urine or stool.  He is able to ambulate.  He also complains of mild left-sided thoracic pleuritic discomfort with deep breathing.  He denies cough.  He denies shortness of breath.  He denies known Covid exposure.  He reports occasional use of alcohol and intermittent "snorting" of cocaine. Last use of cocaine was several weeks ago per report. He denies IV drug use.   The history is provided by the patient and medical records.  Abnormal Lab Time since result:  Postive blood cultures       Past Medical History:  Diagnosis Date  . Hypertension   . Stroke [facility])     There are no problems to display for this patient.   No past surgical history on file.     No family history on file.  Social History   Tobacco Use  . Smoking status: Current Every Day Smoker    Types: Cigarettes  . Smokeless tobacco: Never Used  Substance Use Topics  . Alcohol use: Yes  . Drug use: Yes    Types: Cocaine    Comment: rare use about a month ago     Home Medications Prior to Admission medications   Medication Sig Start Date End Date Taking?  Authorizing Provider  amitriptyline (ELAVIL) 50 MG tablet Take 50 mg by mouth at bedtime.    [provider]  amLODipine (NORVASC) 10 MG tablet Take 10 mg by mouth daily.    [provider]  cloNIDine (CATAPRES) 0.2 MG tablet Take 0.2 mg by mouth 3 (three) times daily.    [provider]  ibuprofen (ADVIL,MOTRIN) 200 MG tablet Take 200 mg by mouth every 6 (six) hours as needed. For pain    [provider]  levETIRAcetam (KEPPRA) 500 MG tablet Take 1 tablet (500 mg total) by mouth 2 (two) times daily. 07/31/17 08/30/17  Eber Hong, MD  metoprolol succinate (TOPROL-XL) 25 MG 24 hr tablet Take 25 mg by mouth daily.    [provider]  traZODone (DESYREL) 50 MG tablet Take 25 mg by mouth at bedtime.    [provider]    Allergies    Patient has no known allergies.  Review of Systems   Review of Systems  All other systems reviewed and are negative.   Physical Exam Updated Vital Signs BP (!) 149/91   Pulse (!) 125   Temp 99.5 F (37.5 C) (Oral)   Resp 20   SpO2 95%   Physical Exam Vitals and nursing note reviewed.  Constitutional:      General: He is not in  acute distress.    Appearance: He is well-developed.  HENT:     Head: Normocephalic and atraumatic.  Eyes:     Conjunctiva/sclera: Conjunctivae normal.     Pupils: Pupils are equal, round, and reactive to light.  Cardiovascular:     Rate and Rhythm: Normal rate and regular rhythm.     Heart sounds: Normal heart sounds.  Pulmonary:     Effort: Pulmonary effort is normal. No respiratory distress.     Breath sounds: Normal breath sounds.  Abdominal:     General: There is no distension.     Palpations: Abdomen is soft.     Tenderness: There is no abdominal tenderness.  Musculoskeletal:        General: No deformity. Normal range of motion.     Cervical back: Normal range of motion and neck supple.     Comments: Patient localizes pain to the mid lumbar area however he is  nontender with palpation.  Patient with 5 out of 5 strength both lower extremities.  Normal ambulation.  Skin:    General: Skin is warm and dry.  Neurological:     Mental Status: He is alert and oriented to person, place, and time.     ED Results / Procedures / Treatments   Labs (all labs ordered are listed, but only abnormal results are displayed) Labs Reviewed  COMPREHENSIVE METABOLIC PANEL - Abnormal; Notable for the following components:      Result Value   CO2 21 (*)    Glucose, Bld 105 (*)    Creatinine, Ser 1.70 (*)    Calcium 8.4 (*)    Albumin 2.9 (*)    GFR calc non Af Amer 49 (*)    GFR calc Af Amer 57 (*)    All other components within normal limits  CBC WITH DIFFERENTIAL/PLATELET - Abnormal; Notable for the following components:   Lymphs Abs 0.5 (*)    Abs Immature Granulocytes 0.10 (*)    All other components within normal limits  RESPIRATORY PANEL BY RT PCR (FLU A&B, COVID)  CULTURE, BLOOD (ROUTINE X 2)  CULTURE, BLOOD (ROUTINE X 2)  LACTIC ACID, PLASMA  PROTIME-INR  ETHANOL  LIPASE, BLOOD  URINALYSIS, ROUTINE W REFLEX MICROSCOPIC  RAPID URINE DRUG SCREEN, HOSP PERFORMED  LACTIC ACID, PLASMA  TROPONIN I (HIGH SENSITIVITY)  TROPONIN I (HIGH SENSITIVITY)    EKG EKG Interpretation  Date/Time:  Friday May 13 2019 09:45:37 EST Ventricular Rate:  96 PR Interval:    QRS Duration: 86 QT Interval:  338 QTC Calculation: 428 R Axis:   56 Text Interpretation: Sinus rhythm Confirmed by Kristine Royal [phone]) on 05/13/2019 9:51:38 AM   Radiology MR Lumbar Spine W Wo Contrast  Result Date: 05/13/2019 CLINICAL DATA:  Low back pain. Fever and positive blood cultures. Rule out spinal infection. EXAM: MRI LUMBAR SPINE WITHOUT AND WITH CONTRAST TECHNIQUE: Multiplanar and multiecho pulse sequences of the lumbar spine were obtained without and with intravenous contrast. CONTRAST:  41mL GADAVIST GADOBUTROL 1 MMOL/ML IV SOLN COMPARISON:  None. FINDINGS:  Segmentation:  Normal Alignment:  Normal Vertebrae: Negative for fracture or mass. No bone marrow edema or abnormal enhancement. Negative for spinal infection Conus medullaris and cauda equina: Conus extends to the L1-2 level. Conus and cauda equina appear normal. Paraspinal and other soft tissues: Negative for paraspinous mass or adenopathy. Negative for soft tissue edema or abscess. Disc levels: L1-2: Negative L2-3: Negative L3-4: Negative L4-5: Mild disc space narrowing. Small central disc protrusion and mild  facet degeneration. Negative for spinal or foraminal stenosis L5-S1: Small left foraminal disc protrusion. Possible impingement left S1 nerve root. IMPRESSION: Negative for spinal infection Small central disc protrusion L4-5 Small left foraminal disc protrusion L5-S1 with expected impingement left S1 nerve root. Electronically Signed   By: Franchot Gallo M.D.   On: 05/13/2019 13:04   DG Chest Port 1 View  Result Date: 05/13/2019 CLINICAL DATA:  Fever with reported sepsis EXAM: PORTABLE CHEST 1 VIEW COMPARISON:  July 31, 2011 FINDINGS: The lungs are clear. Heart size and pulmonary vascularity are normal. No adenopathy. No bone lesions. IMPRESSION: No abnormality noted. Electronically Signed   By: Lowella Grip III M.D.   On: 05/13/2019 10:00    Procedures Procedures (including critical care time) CRITICAL CARE Performed by: Valarie Merino   Total critical care time: 30 minutes  Critical care time was exclusive of separately billable procedures and treating other patients.  Critical care was necessary to treat or prevent imminent or life-threatening deterioration.  Critical care was time spent personally by me on the following activities: development of treatment plan with patient and/or surrogate as well as nursing, discussions with consultants, evaluation of patient's response to treatment, examination of patient, obtaining history from patient or surrogate, ordering and performing  treatments and interventions, ordering and review of laboratory studies, ordering and review of radiographic studies, pulse oximetry and re-evaluation of patient's condition.   Medications Ordered in ED Medications - No data to display  ED Course  I have reviewed the triage vital signs and the nursing notes.  Pertinent labs & imaging results that were available during my care of the patient were reviewed by me and considered in my medical decision making (see chart for details).    MDM Rules/Calculators/A&P                      MDM  Screen complete  ABNER ARDIS was evaluated in Emergency Department on 05/13/2019 for the symptoms described in the history of present illness. He was evaluated in the context of the global COVID-19 pandemic, which necessitated consideration that the patient might be at risk for infection with the SARS-CoV-2 virus that causes COVID-19. Institutional protocols and algorithms that pertain to the evaluation of patients at risk for COVID-19 are in a state of rapid change based on information released by regulatory bodies including the CDC and federal and state organizations. These policies and algorithms were followed during the patient's care in the ED.  Patient is presenting for evaluation of suspected bacteremia.  Patient had positive blood cultures from 3 days prior.  Initial work-up today is reassuring and that his white count has improved.  MRI obtained of his lumbar spine does not reveal abscess or other infection.  Given uncertain source of the patient's bacteremia he will need further work-up as an inpatient.  Medicine team is aware of case and will evaluate for admission.  Final Clinical Impression(s) / ED Diagnoses Final diagnoses:  Bacteremia    Rx / DC Orders ED Discharge Orders    None       Valarie Merino, MD 05/13/19 1347

## 2019-05-13 NOTE — ED Triage Notes (Signed)
Pt here after being called this morning with results of positive blood cultures on 1/12. Pt reports fever throughout the night and generalized pain and malaise.

## 2019-05-13 NOTE — ED Notes (Signed)
Pt to MRI

## 2019-05-13 NOTE — Plan of Care (Signed)

## 2019-05-13 NOTE — ED Notes (Signed)
Patient transported to MRI 

## 2019-05-13 NOTE — Telephone Encounter (Signed)
Received positive preliminary blood culture results gram positive cocci anaerobic and aerobic bottles 2/2. Spoke with patient's spouse; states he has continued to have sweats and chills and reports back pain. Advised to return to [facility] ED for further eval. Spouse states she will encourage him to do so.

## 2019-05-13 NOTE — Progress Notes (Signed)
Pharmacy Antibiotic Note  George Burke is a 44 y.o. male admitted on 05/13/2019 with bacteremia.  Pharmacy has been consulted for vancomycin dosing. PT is afebrile and WBC is WNL. Scr is elevated at 1.7. Lactic acid is normal.   Plan: Vancomycin 2gm IV x 1 then 1750mg  IV Q24H F/u renal fxn, C&S, clinical status and peak/trough at SS  Weight: 198 lb (89.8 kg)  Temp (24hrs), Avg:99.5 F (37.5 C), Min:99.5 F (37.5 C), Max:99.5 F (37.5 C)  Recent Labs  Lab 05/10/19 1136 05/13/19 1013  WBC 20.7* 6.6  CREATININE 1.87* 1.70*  LATICACIDVEN 1.0 0.9    Estimated Creatinine Clearance: 60.9 mL/min (A) (by C-G formula based on SCr of 1.7 mg/dL (H)).    No Known Allergies  Antimicrobials this admission: Vanc 1/15>> Zosyn x 1 1/5  Dose adjustments this admission: N/A  Microbiology results: 1/12 Blood - [facility]  Thank you for allowing pharmacy to be a part of this patient's care.  Sumayah Bearse, 3/12 05/13/2019 10:04 AM

## 2019-05-13 NOTE — H&P (Signed)
Date: 05/13/2019               Patient Name:  George Burke MRN: [id number]  DOB: 05/15/1975 Age / Sex: 44 y.o., male   PCP: Patient, No Pcp Per         Medical Service: Internal Medicine Teaching Service         Attending Physician: Dr. Rogelia Boga, Austin Miles, MD    First Contact: Dr. Huel Cote Pager: [phone]  Second Contact: Dr. Chesley Mires Pager: [phone]       After Hours (After 5p/  First Contact Pager: [phone]  weekends / holidays): Second Contact Pager: [phone]   Chief Complaint: Fever and chills   History of Present Illness:   George Burke is a 44 y/o male with a PMHx of HTN, epilepsy (last seizure in 03/2020), ICH (2018), polysubstance abuse who presents to the ED with c/o fever, chills.   Patient was recently seen in the [facility] ED on 05/10/2019 but left prior to being treated. At the time, he did have blood cultures drawn that grew 4/4 coag neg staph. Patient was contacted today by [facility] staff and encouraged to return to receive treatment at this time.   Mr. Matteucci reports that he began not feeling well approximately 5 days ago in addition to having nausea but denies vomiting.  He continues to have significant malaise during the next day, in addition to fever and left-sided lumbar back pain that radiated to the hip making it difficult to walk, sit.  He also noticed that he had some constipation on the state and attempted to use a suppository without relief.  He then tried MiraLAX however, this caused him to throw up.  Upon throwing up, he began to have severe chest pain and rib pain that lasted the next to 3 days.  Pain is reproducible with palpation and improved by sitting in a hot shower and leaning forward.  He endorses night sweats for the past 2 nights, fevers, chills but denies shortness of breath, runny nose, sore throat, cough, vision changes, paresthesias, dysuria, hematuria, right-sided back pain.  Mr. Baratta denies any recent open sores or IV drug use. No recent  tattoo work. He does endorse some inhaled cocaine approximately 3 weeks ago.  Patient reports that he stopped taking all his blood pressure medication but is still compliant with Keppra.  ED course: CMP remarkable for elevated creatinine at 1.70, no other electrolyte abnormalities.  CBC is unremarkable.  Lipase, ethanol, troponin, lactic acid all negative.  Covid, influenza panel negative.  Mild proteinuria on UA.  UDS positive for cocaine and benzos. MRI lumbar negative for evidence of infection.   Meds:  No current facility-administered medications on file prior to encounter.   Current Outpatient Medications on File Prior to Encounter  Medication Sig  . bisacodyl (DULCOLAX) 10 MG suppository Place 30 mg rectally as needed for mild constipation or moderate constipation.  . bisacodyl (FLEET) 10 MG/30ML ENEM Place 10 mg rectally once as needed (for constipation).  Marland Kitchen ibuprofen (ADVIL,MOTRIN) 200 MG tablet Take 600 mg by mouth every 3 (three) hours as needed (for pain).   Marland Kitchen levETIRAcetam (KEPPRA) 500 MG tablet Take 1 tablet (500 mg total) by mouth 2 (two) times daily.  . traZODone (DESYREL) 50 MG tablet Take 50 mg by mouth at bedtime.  Marland Kitchen amitriptyline (ELAVIL) 50 MG tablet Take 50 mg by mouth at bedtime.  Marland Kitchen amLODipine (NORVASC) 10 MG tablet Take 10 mg by mouth daily.  Marland Kitchen  cloNIDine (CATAPRES) 0.2 MG tablet Take 0.2 mg by mouth 3 (three) times daily.  . metoprolol succinate (TOPROL-XL) 25 MG 24 hr tablet Take 25 mg by mouth daily.   Allergies: Allergies as of 05/13/2019  . (No Known Allergies)   Past Medical History:  Diagnosis Date  . Hypertension   . Stroke [facility])    Family History:  Brother and mother: Type 2 diabetes   Social History:  Endorses drug use, most commonly cocaine, with most recent use 3 weeks ago. Works as a Academic librarian Per chart review, former smoker, quit in 2018 Occasional alcohol use  Review of Systems: A complete ROS was negative except as per HPI.   Physical  Exam: Blood pressure (!) 162/108, pulse 84, temperature 99 F (37.2 C), temperature source Oral, resp. rate 18, weight 89.8 kg, SpO2 100 %.  Physical Exam Vitals and nursing note reviewed.  Constitutional:      General: He is not in acute distress.    Appearance: He is normal weight. He is not toxic-appearing.  HENT:     Head: Normocephalic and atraumatic.     Mouth/Throat:     Mouth: Mucous membranes are dry.     Pharynx: Oropharynx is clear. No oropharyngeal exudate or posterior oropharyngeal erythema.     Comments: Some dental carries noted Cardiovascular:     Rate and Rhythm: Normal rate and regular rhythm.     Pulses: Normal pulses.     Heart sounds: No murmur.  Pulmonary:     Effort: Pulmonary effort is normal. No respiratory distress.     Breath sounds: Normal breath sounds. No wheezing, rhonchi or rales.  Abdominal:     General: Bowel sounds are normal. There is no distension.     Palpations: Abdomen is soft.     Tenderness: There is no abdominal tenderness. There is left CVA tenderness. There is no right CVA tenderness or guarding.  Musculoskeletal:     Cervical back: Normal.     Thoracic back: Normal.     Lumbar back: Tenderness (Left side only) present. No signs of trauma, lacerations or spasms.     Right lower leg: No edema.     Left lower leg: No edema.  Skin:    General: Skin is warm and dry.     Coloration: Skin is not jaundiced.     Findings: No bruising or lesion.  Neurological:     General: No focal deficit present.     Mental Status: He is alert and oriented to person, place, and time. Mental status is at baseline.     Motor: No weakness.  Psychiatric:        Mood and Affect: Mood normal.        Behavior: Behavior normal.    EKG: personally reviewed my interpretation is: Rate of 96. Regular, sinus rhythm. No ST segment elevations or depression. All intervals WNL.   CXR: personally reviewed my interpretation is: No pleural effusion bilaterally. No  opacities.   Assessment & Plan by Problem: Active Problems:   Bacteremia due to coagulase-negative Staphylococcus  Mr. Taysom Glymph is a 44 y/o male with a PMHx of HTN, epilepsy (last seizure in 03/2020), ICH with resolved right hemiparesis (2018), polysubstance abuse who is currently admitted for staph bacteremia after presenting with fever, chills.   # Coagulase Negative Staph Bacteremia  Patient presented to the ED on 1/12 and had blood cultures drawn but did not stay for treatment.  Cultures grew 2 out of 2  coag negative staph.  Patient continued to have fever, chills and presented to the ED today after being called regarding culture results.  CBC shows leukocytosis from initial presentation has resolved.  MRI is negative for lumbar involvement.  Currently source is unknown as patient has no open sores on skin, no active dental infection, denies IV drug use.  Received 1 dose of Zosyn in the ED.  - Repeat blood cultures drawn and pending - CBC in the AM - TTE pending - Tylenol as needed for fevers  - Vancomycin  # Left Lumbar Pain MRI negative. Denies previous history of similar pain or trauma to the area.  Differential includes muscle spasm.  Pain is near CVA region, but no evidence of infection per UA.  - Oxycodone 5 as needed for moderate pain  # Hypertension  No longer taking any of his home meds.  Was recently admitted to [facility] for seizure, and discharged on no antihypertensive medications as BP was well controlled during that time.  Noted to be hypertensive when transition to the floor, at 162/108.  We will restart some of his former home meds.  - Amlodipine 10 mg   # Acute Kidney Injury Likely secondary to acute febrile illness with decreased PO intake.   - Gentle IVF: LR @ 75 cc/hr  - BMP in the AM  # Epilepsy  Per chart review, felt to be due to history of ICH +/-continue drug use.  Last documented seizure in December of 2020. Patient was seen at [place] for  work-up, which included continuous EEG. Unfortunately, patient removed electrodes after nearly 12 hours and refused to put them back on.  During the time he did wear them, no electrographic seizures were seen. He has followed up with outpatient neurology since.   - Keppra 500 mg BID  # Constipation Initiate bowel regimen.  - Senna  - Miralax   Code: Full DVT ppx: Lovenox  Diet: Regular   Dispo: Admit patient to Inpatient with expected length of stay greater than 2 midnights.  Signed: Dr. Jose Persia Internal Medicine PGY-1  Pager: [phone] 05/13/2019, 6:27 PM

## 2019-05-13 NOTE — ED Notes (Signed)
Pt informed his mom was calling and would like an update

## 2019-05-14 LAB — CBC
HCT: 41.8 % (ref 39.0–52.0)
Hemoglobin: 14.4 g/dL (ref 13.0–17.0)
MCH: 30.9 pg (ref 26.0–34.0)
MCHC: 34.4 g/dL (ref 30.0–36.0)
MCV: 89.7 fL (ref 80.0–100.0)
Platelets: 237 10*3/uL (ref 150–400)
RBC: 4.66 MIL/uL (ref 4.22–5.81)
RDW: 12.8 % (ref 11.5–15.5)
WBC: 8.6 10*3/uL (ref 4.0–10.5)
nRBC: 0 % (ref 0.0–0.2)

## 2019-05-14 LAB — HEPATITIS B CORE ANTIBODY, IGM: Hep B C IgM: NONREACTIVE

## 2019-05-14 LAB — HEPATITIS B SURFACE ANTIGEN: Hepatitis B Surface Ag: NONREACTIVE

## 2019-05-14 LAB — COMPREHENSIVE METABOLIC PANEL
ALT: 82 U/L — ABNORMAL HIGH (ref 0–44)
AST: 65 U/L — ABNORMAL HIGH (ref 15–41)
Albumin: 2.8 g/dL — ABNORMAL LOW (ref 3.5–5.0)
Alkaline Phosphatase: 118 U/L (ref 38–126)
Anion gap: 11 (ref 5–15)
BUN: 15 mg/dL (ref 6–20)
CO2: 25 mmol/L (ref 22–32)
Calcium: 8.6 mg/dL — ABNORMAL LOW (ref 8.9–10.3)
Chloride: 101 mmol/L (ref 98–111)
Creatinine, Ser: 1.44 mg/dL — ABNORMAL HIGH (ref 0.61–1.24)
GFR calc Af Amer: >60 mL/min (ref >60)
GFR calc non Af Amer: 60 mL/min — ABNORMAL LOW (ref >60)
Glucose, Bld: 117 mg/dL — ABNORMAL HIGH (ref 70–99)
Potassium: 3.3 mmol/L — ABNORMAL LOW (ref 3.5–5.1)
Sodium: 137 mmol/L (ref 135–145)
Total Bilirubin: 1.3 mg/dL — ABNORMAL HIGH (ref 0.3–1.2)
Total Protein: 6.4 g/dL — ABNORMAL LOW (ref 6.5–8.1)

## 2019-05-14 LAB — HIV ANTIBODY (ROUTINE TESTING W REFLEX): HIV Screen 4th Generation wRfx: NONREACTIVE

## 2019-05-14 LAB — CULTURE, BLOOD (SINGLE): Special Requests: ADEQUATE

## 2019-05-14 LAB — ECHOCARDIOGRAM COMPLETE: Weight: 3167.99 oz

## 2019-05-14 MED ORDER — LACTATED RINGERS IV SOLN: INTRAVENOUS | Status: AC

## 2019-05-14 MED ORDER — ACETAMINOPHEN 650 MG RE SUPP: 650.0000 mg | Freq: Four times a day (QID) | RECTAL | Status: DC | PRN

## 2019-05-14 MED ORDER — ACETAMINOPHEN 500 MG PO TABS
500.0000 mg | ORAL_TABLET | ORAL | Status: DC | PRN
  Administered 2019-05-14 – 2019-05-19 (×12): 500 mg via ORAL
  Filled 2019-05-14 (×11): qty 1

## 2019-05-14 MED ORDER — POLYETHYLENE GLYCOL 3350 17 G PO PACK
17.0000 g | PACK | Freq: Two times a day (BID) | ORAL | Status: DC
  Administered 2019-05-14 – 2019-05-18 (×8): 17 g via ORAL
  Filled 2019-05-14 (×8): qty 1

## 2019-05-14 NOTE — Progress Notes (Addendum)
Subjective:   George Burke feels a little better this morning. His back pain has improved since admission. Still endorsing subjective fevers, chills, decreased appetite. No BM yet. Denies nausea, vomiting.   Patient states he had an old amoxicillin prescription at home that he took approximately 2 days between his prior ED visit on 1/12 and this admission.   Objective:  Vital signs in last 24 hours: Vitals:   05/14/19 0459 05/14/19 0819 05/14/19 0941 05/14/19 1048  BP: (!) 157/104  (!) 164/105 (!) 145/95  Pulse: 85  99 93  Resp: 18  18 16   Temp: 99 F (37.2 C) 99.3 F (37.4 C) (!) 100.7 F (38.2 C) 99.8 F (37.7 C)  TempSrc: Oral Oral Oral Oral  SpO2: 97%  98% 97%  Weight:        Physical Exam Vitals and nursing note reviewed.  Constitutional:      General: He is not in acute distress.    Appearance: He is normal weight.  Cardiovascular:     Rate and Rhythm: Normal rate and regular rhythm.     Pulses: Normal pulses.     Heart sounds: No murmur.  Pulmonary:     Effort: Pulmonary effort is normal. No respiratory distress.     Breath sounds: Normal breath sounds. No wheezing or rales.  Abdominal:     General: Bowel sounds are normal. There is no distension.     Palpations: Abdomen is soft.     Tenderness: There is abdominal tenderness (generalized tenderness, worse on the LUQ and LLQ).  Skin:    General: Skin is warm and dry.  Neurological:     General: No focal deficit present.     Mental Status: He is alert and oriented to person, place, and time. Mental status is at baseline.  Psychiatric:        Mood and Affect: Mood normal.        Behavior: Behavior normal.    Assessment/Plan:  Active Problems:   Bacteremia due to coagulase-negative Staphylococcus  George Burke is a 44 y/o male with a PMHx of HTN, epilepsy (last seizure in 03/2020), ICH with resolved right hemiparesis (2018), polysubstance abuse who is currently admitted for staph bacteremia after  presenting with fever, chills.   # Coagulase Negative Staph Bacteremia  Patient presented to the ED on 1/12 and had blood cultures drawn but did not stay for treatment. Cultures grew 2 out of 2 coag negative staph.  Currently source is unknown as patient has no open sores on skin, no active dental infection, denies IV drug use. So far, repeat BCx are NGTD. However, concerned that cultures won't grow given that patient took two days of Amoxicillin prior to admission. He is febrile this AM but hemodynamically stable. TTE was negative but valves were difficult to visualize, so depending on blood cultures, may need a TEE.   - Repeat blood cultures drawn and pending - CBC in the AM - Tylenol as needed for fevers  - Vancomycin  # Left Lumbar Pain MRI negative. Differential includes muscle spasm. Pain is better controlled this AM with oxycodone on board.   - Oxycodone 5 as needed for moderate pain  # Hypertension  BP improving overnight.  - Amlodipine 10 mg   # Acute Kidney Injury Likely secondary to acute febrile illness with decreased PO intake. Creatinine improving this AM. Will continue IVF.   - CMP tomorrow AM - IVF LR 75 cc/hr for 24 hours  # Constipation No  BMs yet and having some abdominal discomfort secondary to this. This is a new problem for patient. Will increased Miralax to BID. If needed, can try enemas.   - Senna BID - Miralax BID  # Elevated LFTs AST and ALT both increased this morning to 65 and 82 respectively. Unknown source currently, although may be related to bacteremia. No alcohol abuse reported. Per chart review, he did have a similar mild elevated back in 2018 and 2019, both resolving without intervention. Will monitor closely.   - CMP in the AM - Hep B and Hep C work up   # Epilepsy   - Keppra 500 mg BID  Dispo: Anticipated discharge pending further medical work up.   Dr. Verdene Lennert Internal Medicine PGY-1  Pager: [phone] 05/14/2019,  10:58 AM

## 2019-05-14 NOTE — Progress Notes (Addendum)
Patient's temp 102.4. MD paged to be notified. Ice packs and cold wash cloth applied to patient. PRN tylenol administered. Will continue to monitor.

## 2019-05-14 NOTE — Progress Notes (Signed)
Date: 05/14/2019  Patient name: George Burke  Medical record number: [id number]  Date of birth: 1975-10-01   I have seen and evaluated George Burke and discussed their care with the Residency Team. George Burke is a 44 yo man admitted for coag negative staph bacteremia.  He initially presented to the ED on January 12 with back pain, fever lethargy, malaise, and emesis.  He left before being seen.  Blood cultures obtained, 1 peripheral right antecubital needlestick, showed growth in both the aerobic and anaerobic bottles and patient was called for admission.  In addition to the symptoms present at the first ER appointment, he endorsed constipation, chest pain following emesis night sweats, and chills.  He denied IV drug use, open sores, recent tattoos although he did use inhaled cocaine 3 weeks prior to admission.  This morning, he is feeling a bit better but his back still bothers him from lying in the hospital bed.  He has not yet had a bowel movement and feels very uncomfortable in regards to this.  Nausea and vomiting have resolved.  He still is having fevers and chills.  The inpatient team was able to elicit that he used 2 days of leftover amoxicillin between his January 12 ER visit and yesterday's admission.  Vitals:   05/14/19 0941 05/14/19 1048  BP: (!) 164/105 (!) 145/95  Pulse: 99 93  Resp: 18 16  Temp: (!) 100.7 F (38.2 C) 99.8 F (37.7 C)  SpO2: 98% 97%  24-hour T-max 100.7 Lying in bed, no acute distress H RRR no MRG L CTA B anteriorly Abdomen positive bowel sounds  Pertinent labs Potassium 3.3 Creatinine 1.87 on the 12th, now 1.44 Albumin 2.8 AST 31  - 65 ALT 37  - 82  MRI of lumbar spine was negative for spinal infection but showed a small left foraminal disc protrusion at L5-S1 with expected impingement of the left S1 nerve root  I personally viewed the CXR images and confirmed my reading with the official read.  1 view AP portable semierect, good quality, no  overt abnormalities  I personally viewed the EKG and confirmed my reading with the official read.  Sinus rhythm, normal axis, no ischemic changes.  Assessment and Plan: I have seen and evaluated the patient as outlined above. I agree with the formulated Assessment and Plan as detailed in the residents' note, with the following changes:  George Burke is a 44 yo man admitted for coag negative staph bacteremia, obtained from single needlestick but positive growth in both anaerobic and aerobic bottles.  Patient self treated with leftover amoxicillin for 2 days at home but continues to have symptoms.  Repeat blood cultures did not have adequate volume so we will repeat additional blood cultures today although he did get a dose of vancomycin yesterday.  TTE did not show any vegetations.  He does not endorse any classic risk factors for bacteremia.  1.  Coag negative staph bacteremia - his positive blood cultures were from 1 phlebotomy stick but he did have positive growth in both bottles.  Subsequent blood cultures, obtained yesterday and today, will be inhibited by antibiotic usage.  As he is symptomatic, this is likely not a contaminant.  MRI of lumbar spine did not indicate any metastatic infection sites and he has no indwelling devices.  If his cultures remain negative, he likely will only need 5 days of antibiotics.  If his repeat cultures turn positive, he will need a TEE and longer antibiotics. -Follow-up blood  culture results from the 15th -Follow-up blood culture results from the 16th  2.  Left lumbar radiculopathy = his MRI showed enhancement of the left SI nerve root but will need to correlate his symptoms and exam findings with the radiology findings.  If consistent, would start gabapentin for neuropathic pain.  3.  Elevated aminotransferase -  -Hep C antibody -Hep B surface antibody to see if he needs vaccination -Hep B: IgM and hep B surface antigen to assess for acute infection  Other  issues per Dr. Renaee Munda H&P  George Spain, MD 1/16/202112:32 PM

## 2019-05-14 NOTE — Progress Notes (Addendum)
Patient temp 100.7, BP 164/105 scheduled Norvasc and PRN Tylenol just administered. Paged MD to be notified. Awaiting return call. Will continue to monitor.   Spoke with MD awaiting orders.

## 2019-05-15 LAB — CBC WITH DIFFERENTIAL/PLATELET
Abs Immature Granulocytes: 0.13 10*3/uL — ABNORMAL HIGH (ref 0.00–0.07)
Basophils Absolute: 0.1 10*3/uL (ref 0.0–0.1)
Basophils Relative: 1 %
Eosinophils Absolute: 0.1 10*3/uL (ref 0.0–0.5)
Eosinophils Relative: 1 %
HCT: 41 % (ref 39.0–52.0)
Hemoglobin: 14.3 g/dL (ref 13.0–17.0)
Immature Granulocytes: 1 %
Lymphocytes Relative: 14 %
Lymphs Abs: 1.4 10*3/uL (ref 0.7–4.0)
MCH: 31 pg (ref 26.0–34.0)
MCHC: 34.9 g/dL (ref 30.0–36.0)
MCV: 88.7 fL (ref 80.0–100.0)
Monocytes Absolute: 1.7 10*3/uL — ABNORMAL HIGH (ref 0.1–1.0)
Monocytes Relative: 17 %
Neutro Abs: 6.8 10*3/uL (ref 1.7–7.7)
Neutrophils Relative %: 66 %
Platelets: 241 10*3/uL (ref 150–400)
RBC: 4.62 MIL/uL (ref 4.22–5.81)
RDW: 12.7 % (ref 11.5–15.5)
WBC: 10.3 10*3/uL (ref 4.0–10.5)
nRBC: 0 % (ref 0.0–0.2)

## 2019-05-15 LAB — COMPREHENSIVE METABOLIC PANEL
ALT: 112 U/L — ABNORMAL HIGH (ref 0–44)
AST: 70 U/L — ABNORMAL HIGH (ref 15–41)
Albumin: 2.7 g/dL — ABNORMAL LOW (ref 3.5–5.0)
Alkaline Phosphatase: 118 U/L (ref 38–126)
Anion gap: 12 (ref 5–15)
BUN: 11 mg/dL (ref 6–20)
CO2: 24 mmol/L (ref 22–32)
Calcium: 8.6 mg/dL — ABNORMAL LOW (ref 8.9–10.3)
Chloride: 101 mmol/L (ref 98–111)
Creatinine, Ser: 1.27 mg/dL — ABNORMAL HIGH (ref 0.61–1.24)
GFR calc Af Amer: >60 mL/min (ref >60)
GFR calc non Af Amer: >60 mL/min (ref >60)
Glucose, Bld: 95 mg/dL (ref 70–99)
Potassium: 3.4 mmol/L — ABNORMAL LOW (ref 3.5–5.1)
Sodium: 137 mmol/L (ref 135–145)
Total Bilirubin: 1 mg/dL (ref 0.3–1.2)
Total Protein: 6.1 g/dL — ABNORMAL LOW (ref 6.5–8.1)

## 2019-05-15 LAB — HEPATITIS B SURFACE ANTIBODY, QUANTITATIVE: Hep B S AB Quant (Post): <3.1 m[IU]/mL — ABNORMAL LOW (ref >9.9)

## 2019-05-15 LAB — HEPATITIS C ANTIBODY (REFLEX): HCV Ab: <0.1 s/co ratio (ref 0.0–0.9)

## 2019-05-15 LAB — HCV COMMENT:

## 2019-05-15 MED ORDER — VANCOMYCIN HCL IN DEXTROSE 1-5 GM/200ML-% IV SOLN
1000.0000 mg | Freq: Two times a day (BID) | INTRAVENOUS | Status: DC
  Administered 2019-05-15 – 2019-05-16 (×2): 1000 mg via INTRAVENOUS
  Filled 2019-05-15 (×3): qty 200

## 2019-05-15 MED ORDER — OXYCODONE HCL 5 MG PO TABS
5.0000 mg | ORAL_TABLET | Freq: Four times a day (QID) | ORAL | Status: DC | PRN
  Administered 2019-05-15 – 2019-05-19 (×16): 5 mg via ORAL
  Filled 2019-05-15 (×18): qty 1

## 2019-05-15 MED ORDER — OXYCODONE HCL 5 MG PO TABS
5.0000 mg | ORAL_TABLET | Freq: Once | ORAL | Status: AC
  Administered 2019-05-15: 22:00:00 5 mg via ORAL

## 2019-05-15 MED ORDER — GABAPENTIN 300 MG PO CAPS
300.0000 mg | ORAL_CAPSULE | Freq: Three times a day (TID) | ORAL | Status: DC
  Administered 2019-05-15 – 2019-05-16 (×4): 300 mg via ORAL
  Filled 2019-05-15 (×4): qty 1

## 2019-05-15 MED ORDER — OXYCODONE HCL 5 MG PO TABS: 5.0000 mg | ORAL_TABLET | Freq: Four times a day (QID) | ORAL | Status: DC | PRN

## 2019-05-15 MED ORDER — CYCLOBENZAPRINE HCL 5 MG PO TABS
5.0000 mg | ORAL_TABLET | Freq: Three times a day (TID) | ORAL | Status: DC | PRN
  Administered 2019-05-15 – 2019-05-19 (×8): 5 mg via ORAL
  Filled 2019-05-15 (×8): qty 1

## 2019-05-15 NOTE — Progress Notes (Signed)
PHARMACY - PHYSICIAN COMMUNICATION CRITICAL VALUE ALERT - BLOOD CULTURE IDENTIFICATION (BCID)  George Burke is an 44 y.o. male who presented to [facility] on 05/13/2019  Assessment:  44 y.o. male who presented to the [facility] on 1/12 with fever/chills and at that time had a single blood culture drawn with both aerobic and anaerobic bottles growing Coag Neg Staph, so the patient called back for admit on 1/15. Repeat BCx drawn 1/15 now growing GPC in 1 of 4 bottles (anaerobic). BCx from 1/16 remain negative to date  Name of physician (or Provider) Contacted: [place], Carley ([facility]) via Epic secure message  Current antibiotics: vancomycin  Changes to prescribed antibiotics recommended:  None for now  No results found for this or any previous visit.   [name], PharmD, BCPS Please check AMION for all [facility] contact numbers Clinical Pharmacist 05/15/2019 10:04 AM

## 2019-05-15 NOTE — Plan of Care (Signed)

## 2019-05-15 NOTE — Progress Notes (Signed)
   Subjective: Patient was febrile to 102.4 yesterday afternoon. Remained afebrile overnight. Still endorsing decreased appetite and has not had a BM yet. Primary complaint is low back pain which he states is being worsened by hospital bed.   Objective:  Vital signs in last 24 hours: Vitals:   05/14/19 1740 05/14/19 1947 05/14/19 2311 05/15/19 0411  BP: (!) 148/93 (!) 153/93 (!) 146/99 (!) 150/94  Pulse: 100 92 94 87  Resp:  20 16 17   Temp:  98.7 F (37.1 C) 99 F (37.2 C) 99.1 F (37.3 C)  TempSrc:  Oral Oral Oral  SpO2:  100% 100% 100%  Weight:       General: awake, alert, sitting up in bed in NAD CV: RRR; no m/r/g Pulm: normal work of breathing; lungs CTAB Abd: BS+. soft, mild generalized tenderness MSK: TTP of left lumbar paraspinal muscles. No decreased sensation in LLE. + SLR on left   Assessment/Plan:  Active Problems:   Bacteremia due to coagulase-negative Staphylococcus  George Burke is a 44 y/o male with a PMHx of HTN, epilepsy (last seizure in 03/2020), ICH withresolvedright hemiparesis (2018), polysubstance abusewho is currently admitted for staph bacteremia after presenting with fever, chills.  # Coagulase Negative Staph Bacteremia Patient presented to the ED on 1/12 and had blood cultures drawn but did not stay for treatment. Cultures grew 2 out of 2 coag negative staph. Currently source is unknown as patient has no open sores on skin,no active dental infection,denies IV drug use. Unfortunately, repeat blood cx 1/15 did not have adequate volume so these were repeated on 1/16 after he had already received dose of vancomycin, in addition to 2 days' worth of amoxicillin prior to admission. Despite this, cx from 1/15 showed 1/4 bottles growing GPC. Unclear how to interpret this since initial cx grew coag negative staph. He continues to be intermittently febrile, but otherwise stable. TTE was negative but valves were difficult to visualize. Will plan to consult  ID in the morning regarding mixed cx data and if pursuing TEE is indicated.    -f/u cx  - trend CBCs -supportive care with Tylenol  -continue Vancomycin  # Left Lumbar Pain MRI revealed possible left-sided S1 nerve compression. Will initiate treatment Gabapentin 300 mg TID and titrate as needed. Patient has been using Oxy q 4 PRN consistently. Will wean back to q 6 today as the gabapentin should better treat this type of pain.   # Hypertension - blood pressures stable - continue amlodipine 10 mg  # Acute Kidney Injury Likely secondary to acute febrile illness with decreased PO intake. Creatinine has trended down with IVF; will d/c once he is back to his regular PO intake.   #Constipation - Senna BID - Miralax BID  # Elevated LFTs AST and ALT both increased this morning to 65 and 82 respectively. Unknown source currently, although may be related to bacteremia. No alcohol abuse reported. Per chart review, he did have a similar mild elevated back in 2018 and 2019, both resolving without intervention.  - Hep B and Hep C work-up pending    # Epilepsy  - Keppra 500 mg BID  Dispo: Anticipated discharge pending further medical work-up.   2020 D, DO 05/15/2019, 7:26 AM Pager: [phone]

## 2019-05-15 NOTE — Progress Notes (Signed)
Pharmacy Antibiotic Note  George Burke is a 44 y.o. male who presented to the [facility] on 1/12 with fever/chills and at that time had a single blood culture drawn with both aerobic and anaerobic bottles growing Coag Neg Staph so the patient called back for admit on 1/15.  Pharmacy has been consulted for Vancomycin dosing.  It is hard to interpret if this is contamination or a pathogen since only 1 blood culture set was drawn. Repeat blood cultures on both 1/15 and 1/16 show no growth to date. Of noting, the patient did receive one dose of Zosyn prior to blood cultures being drawn.  If these remain clear - it appears the team is considering a short course of 5 days.  Renal function has improved with SCr down to 1.27 today, so will adjust the Vancomycin dose today.  Plan: - Adjust Vancomycin to 1g IV every 12 hours (est AUC 429, SCr 1.27, Vd 0.72) - Will continue to follow renal function, culture results, LOT, and antibiotic de-escalation plans   Weight: 198 lb (89.8 kg)  Temp (24hrs), Avg:99.5 F (37.5 C), Min:98.5 F (36.9 C), Max:102.4 F (39.1 C)  Recent Labs  Lab 05/10/19 1136 05/13/19 1013 05/13/19 1320 05/14/19 0418 05/15/19 0405  WBC 20.7* 6.6  --  8.6 10.3  CREATININE 1.87* 1.70*  --  1.44* 1.27*  LATICACIDVEN 1.0 0.9 1.1  --   --     Estimated Creatinine Clearance: 81.5 mL/min (A) (by C-G formula based on SCr of 1.27 mg/dL (H)).    No Known Allergies  Vanc 1/15>> Zosyn x 1 1/5  1/12 Blood >> single set but in both bottles - CoNS - could be contamination 1/15 BCx >> ngtd (after Zosyn) 1/16 BCx >> ngtd  Thank you for allowing pharmacy to be a part of this patient's care.  Georgina Pillion, PharmD, BCPS Clinical Pharmacist Clinical phone for 05/15/2019: [phone] 05/15/2019 8:25 AM   **Pharmacist phone directory can now be found on [email or web address] ([email or web address]).  Listed under [facility].

## 2019-05-16 ENCOUNTER — Inpatient Hospital Stay (HOSPITAL_COMMUNITY): Payer: Self-pay

## 2019-05-16 LAB — CBC
HCT: 40 % (ref 39.0–52.0)
Hemoglobin: 13.6 g/dL (ref 13.0–17.0)
MCH: 30.6 pg (ref 26.0–34.0)
MCHC: 34 g/dL (ref 30.0–36.0)
MCV: 89.9 fL (ref 80.0–100.0)
Platelets: 322 10*3/uL (ref 150–400)
RBC: 4.45 MIL/uL (ref 4.22–5.81)
RDW: 12.9 % (ref 11.5–15.5)
WBC: 10 10*3/uL (ref 4.0–10.5)
nRBC: 0 % (ref 0.0–0.2)

## 2019-05-16 LAB — COMPREHENSIVE METABOLIC PANEL
ALT: 129 U/L — ABNORMAL HIGH (ref 0–44)
AST: 93 U/L — ABNORMAL HIGH (ref 15–41)
Albumin: 2.6 g/dL — ABNORMAL LOW (ref 3.5–5.0)
Alkaline Phosphatase: 147 U/L — ABNORMAL HIGH (ref 38–126)
Anion gap: 10 (ref 5–15)
BUN: 9 mg/dL (ref 6–20)
CO2: 26 mmol/L (ref 22–32)
Calcium: 8.5 mg/dL — ABNORMAL LOW (ref 8.9–10.3)
Chloride: 102 mmol/L (ref 98–111)
Creatinine, Ser: 1.42 mg/dL — ABNORMAL HIGH (ref 0.61–1.24)
GFR calc Af Amer: >60 mL/min (ref >60)
GFR calc non Af Amer: >60 mL/min (ref >60)
Glucose, Bld: 140 mg/dL — ABNORMAL HIGH (ref 70–99)
Potassium: 3.4 mmol/L — ABNORMAL LOW (ref 3.5–5.1)
Sodium: 138 mmol/L (ref 135–145)
Total Bilirubin: 0.9 mg/dL (ref 0.3–1.2)
Total Protein: 6.1 g/dL — ABNORMAL LOW (ref 6.5–8.1)

## 2019-05-16 MED ORDER — GABAPENTIN 300 MG PO CAPS
600.0000 mg | ORAL_CAPSULE | Freq: Three times a day (TID) | ORAL | Status: DC
  Administered 2019-05-16 – 2019-05-17 (×3): 600 mg via ORAL
  Filled 2019-05-16 (×3): qty 2

## 2019-05-16 MED ORDER — POLYETHYLENE GLYCOL 3350 17 G PO PACK: 17.0000 g | PACK | Freq: Once | ORAL | Status: DC

## 2019-05-16 MED ORDER — VANCOMYCIN HCL 2000 MG/400ML IV SOLN
2000.0000 mg | INTRAVENOUS | Status: DC
  Administered 2019-05-16: 15:00:00 2000 mg via INTRAVENOUS
  Filled 2019-05-16 (×2): qty 400

## 2019-05-16 NOTE — Progress Notes (Addendum)
Subjective:   George Burke is feeling better compared to when he came in. Remained afebrile overnight. He has still not had a BM. No history of issues with constipation. He does not take imodium or any other anti-diarrheals at home. He also continues to have left lumbar pain with radiation to his left knee posteriorly. Feels that pain regimen that changed since yesterday is no longer controlling his pain.   Discussed blood cultures and indication for TEE which he is in agreement with.   Objective:  Vital signs in last 24 hours: Vitals:   05/15/19 0411 05/15/19 1314 05/15/19 2122 05/16/19 0537  BP: (!) 150/94 (!) 147/91 (!) 162/100 (!) 147/103  Pulse: 87 (!) 101 94 97  Resp: '17 16 19 18  '$ Temp: 99.1 F (37.3 C) 98.7 F (37.1 C) 99.5 F (37.5 C) 98.9 F (37.2 C)  TempSrc: Oral Oral Oral Oral  SpO2: 100% 97% 99% 98%  Weight:        Physical Exam Constitutional:      General: He is not in acute distress.    Appearance: He is normal weight.  Pulmonary:     Effort: Pulmonary effort is normal. No respiratory distress.  Skin:    General: Skin is warm and dry.  Neurological:     Mental Status: He is alert and oriented to person, place, and time. Mental status is at baseline.     Coordination: Coordination normal.     Gait: Gait normal.  Psychiatric:        Mood and Affect: Mood normal.        Behavior: Behavior normal.    Assessment/Plan:  Active Problems:   Bacteremia due to coagulase-negative Staphylococcus  [name] is a 43 y/o male with a PMHx of HTN, epilepsy (last seizure in 03/2020), ICH withresolvedright hemiparesis (2018), polysubstance abusewho is currently admitted for staph bacteremia after presenting with fever, chills.  # Coagulase Negative Staph Bacteremia Source is unknown as patient has no open sores on skin,no active dental infection,denies IV drug use. Blood cultures:  1/12: coagulase negative staph 2/2 bottles 1/15: Gram positive cocci  1/4 bottles, inadequate blood quantity though 1/16: Gram positive cocci 1/4 bottles  TTE obtained and negative, but valves were not well visualized. Discussed case with Dr. Linus Salmons, ID, who recommends moving forward with TEE, as cultures are persistently growing despite antibiotic treatment. If negative, Dr. Linus Salmons recommended Linezolid on discharge for course of 10-14 days. Afebrile and hemodynamically stable overnight.    -Speciation and susceptibles pending  - TEE  -Tylenol PRN for fever  -continue Vancomycin  # Left Lumbar Pain MRI revealed possible left-sided S1 nerve compression. Pt feels Gabapentin did not alleviate pain at all, so will try increasing dose for now, as this is an acute issue.   - Gabapentin increased to 600 mg TID - Oxycodone 5 mg q6  # Hypertension Blood pressures stable  - continue amlodipine 10 mg  #Constipation Still no BM at this time, per patient, approximately 8 days since. No prior hx of similarly severe constipation or use of Imodium. To evaluate stool burden, will obtain KUB. Will advance treatment to include soap sud enema   - SennaBID - MiralaxBID - KUB  - Soap sud enema   # Elevated LFTs Unknown source currently, although may be related to bacteremia. No alcohol abuse reported. Continues to rise today with new elevation in alk phos. Will trend labs for now.   - CMP  # Epilepsy  - Keppra 500  mg BID   # Acute Kidney Injury - Resolved Likely secondary to acute febrile illness with decreased PO intake. Creatinine back at baseline.   Dispo: Anticipated discharge pending further medical work-up.   Dr. Jose Persia Internal Medicine PGY-1  Pager: [phone] 05/16/2019, 9:25 AM

## 2019-05-16 NOTE — Progress Notes (Signed)
PHARMACY - PHYSICIAN COMMUNICATION CRITICAL VALUE ALERT - BLOOD CULTURE IDENTIFICATION (BCID)  George Burke is an 44 y.o. male who presented to [facility] on 05/13/2019  Assessment:  44 y.o. male who presented to the [facility] on 1/12 with fever/chills and at that time had a single blood culture drawn with both aerobic and anaerobic bottles growing Coag Neg Staph, so the patient called back for admit on 1/15. Repeat BCx drawn 1/15 now growing GPC in 1 of 4 bottles (anaerobic). BCx from 1/16 Also growing GPC, micro lab called to inform they believe these are all the same isolate and are working on susceptibilities.   Current antibiotics: vancomycin  Changes to prescribed antibiotics recommended:  None for now  No results found for this or any previous visit.  Jettie Pagan, PharmD PGY2 Infectious Disease Pharmacy Resident  Clinical Pharmacist 05/16/2019 9:54 AM

## 2019-05-16 NOTE — Plan of Care (Signed)

## 2019-05-16 NOTE — Progress Notes (Signed)
Pharmacy Antibiotic Note  George ULBRICHT is a 44 y.o. male admitted on 05/13/2019 with bacteremia.  Pharmacy has been consulted for Vancomycin dosing.   ID: Vanc D#4 for concern for bacteremia - but looks more like contamination. Tmax/24h: 102.4>99.5, WBC wnl, SCr 1.42 increased, LA 1.1 - TTE obtained and negative, but valves were not well visualized.  Vanc 1/15>> Zosyn x 1 1/5  1/12 Blood >> single set but in both bottles - CoNS - Lab to to sensitivities 1/15 BCx >> Staph epidermidis 1/16 BCx >> GPC x 2  Called lab on 1/18 - appears to be the same organism in all cultures - a coag neg staph.    - 1/17: Vanc 1g/12h (est AUC 429, SCr 1.27, Vd 0.72) - 1/18: Vanco 2g/ 24h (est AUC 476.5, Scr 1.42, VD 0.72)  Plan: - Change Vanco to 2g IV q 24h -  Susceptibilities pending. -  Needs TEE - Watch LFT's rising    Weight: 198 lb (89.8 kg)  Temp (24hrs), Avg:99 F (37.2 C), Min:98.7 F (37.1 C), Max:99.5 F (37.5 C)  Recent Labs  Lab 05/10/19 1136 05/13/19 1013 05/13/19 1320 05/14/19 0418 05/15/19 0405 05/16/19 0311  WBC 20.7* 6.6  --  8.6 10.3 10.0  CREATININE 1.87* 1.70*  --  1.44* 1.27* 1.42*  LATICACIDVEN 1.0 0.9 1.1  --   --   --     Estimated Creatinine Clearance: 72.9 mL/min (A) (by C-G formula based on SCr of 1.42 mg/dL (H)).    No Known Allergies  Ryosuke Ericksen S. Merilynn Finland, PharmD, BCPS Clinical Staff Pharmacist [email or web address] Pasty Spillers 05/16/2019 12:29 PM

## 2019-05-16 NOTE — Progress Notes (Signed)
    [facility] has been requested to perform a transesophageal echocardiogram on George Burke for bacteremia.  After careful review of history and examination, the risks and benefits of transesophageal echocardiogram have been explained including risks of esophageal damage, perforation (1:10,000 risk), bleeding, pharyngeal hematoma as well as other potential complications associated with conscious sedation including aspiration, arrhythmia, respiratory failure and death. Alternatives to treatment were discussed, questions were answered. Patient is willing to proceed.   The patient has elevated LFTs but no reported alcohol abuse. He has had no trouble swallowing or cough of blood.   TEE is scheduled for tomorrow at 11:30 with Dr. Jacques Navy.    Berton Bon, NP  05/16/2019 7:39 PM

## 2019-05-17 ENCOUNTER — Inpatient Hospital Stay (HOSPITAL_COMMUNITY): Payer: Self-pay

## 2019-05-17 ENCOUNTER — Encounter (HOSPITAL_COMMUNITY): Payer: Self-pay | Admitting: Internal Medicine

## 2019-05-17 ENCOUNTER — Inpatient Hospital Stay (HOSPITAL_COMMUNITY): Payer: Self-pay | Admitting: Anesthesiology

## 2019-05-17 ENCOUNTER — Encounter (HOSPITAL_COMMUNITY)
Admission: EM | Disposition: A | Discharge status: Home Health | Payer: Self-pay | Source: Home / Self Care | Attending: Internal Medicine

## 2019-05-17 DIAGNOSIS — R7881 Bacteremia: Secondary | ICD-10-CM

## 2019-05-17 DIAGNOSIS — B957 Other staphylococcus as the cause of diseases classified elsewhere: Secondary | ICD-10-CM

## 2019-05-17 DIAGNOSIS — I058 Other rheumatic mitral valve diseases: Secondary | ICD-10-CM

## 2019-05-17 DIAGNOSIS — F17210 Nicotine dependence, cigarettes, uncomplicated: Secondary | ICD-10-CM

## 2019-05-17 DIAGNOSIS — F1910 Other psychoactive substance abuse, uncomplicated: Secondary | ICD-10-CM

## 2019-05-17 DIAGNOSIS — W503XXA Accidental bite by another person, initial encounter: Secondary | ICD-10-CM

## 2019-05-17 DIAGNOSIS — Z87820 Personal history of traumatic brain injury: Secondary | ICD-10-CM

## 2019-05-17 DIAGNOSIS — S1097XA Other superficial bite of unspecified part of neck, initial encounter: Secondary | ICD-10-CM

## 2019-05-17 DIAGNOSIS — I330 Acute and subacute infective endocarditis: Principal | ICD-10-CM

## 2019-05-17 DIAGNOSIS — I10 Essential (primary) hypertension: Secondary | ICD-10-CM

## 2019-05-17 DIAGNOSIS — I059 Rheumatic mitral valve disease, unspecified: Secondary | ICD-10-CM

## 2019-05-17 HISTORY — PX: TEE WITHOUT CARDIOVERSION: SHX5443

## 2019-05-17 HISTORY — PX: BUBBLE STUDY: SHX6837

## 2019-05-17 LAB — CULTURE, BLOOD (ROUTINE X 2): Special Requests: ADEQUATE

## 2019-05-17 LAB — IRON AND TIBC
Iron: 27 ug/dL — ABNORMAL LOW (ref 45–182)
Saturation Ratios: 10 % — ABNORMAL LOW (ref 17.9–39.5)
TIBC: 259 ug/dL (ref 250–450)
UIBC: 232 ug/dL

## 2019-05-17 LAB — CBC WITH DIFFERENTIAL/PLATELET
Abs Immature Granulocytes: 0.35 10*3/uL — ABNORMAL HIGH (ref 0.00–0.07)
Basophils Absolute: 0.1 10*3/uL (ref 0.0–0.1)
Basophils Relative: 1 %
Eosinophils Absolute: 0.2 10*3/uL (ref 0.0–0.5)
Eosinophils Relative: 2 %
HCT: 39.5 % (ref 39.0–52.0)
Hemoglobin: 13.7 g/dL (ref 13.0–17.0)
Immature Granulocytes: 3 %
Lymphocytes Relative: 19 %
Lymphs Abs: 1.9 10*3/uL (ref 0.7–4.0)
MCH: 31.2 pg (ref 26.0–34.0)
MCHC: 34.7 g/dL (ref 30.0–36.0)
MCV: 90 fL (ref 80.0–100.0)
Monocytes Absolute: 1.5 10*3/uL — ABNORMAL HIGH (ref 0.1–1.0)
Monocytes Relative: 14 %
Neutro Abs: 6.3 10*3/uL (ref 1.7–7.7)
Neutrophils Relative %: 61 %
Platelets: 418 10*3/uL — ABNORMAL HIGH (ref 150–400)
RBC: 4.39 MIL/uL (ref 4.22–5.81)
RDW: 13.2 % (ref 11.5–15.5)
WBC: 10.3 10*3/uL (ref 4.0–10.5)
nRBC: 0 % (ref 0.0–0.2)

## 2019-05-17 LAB — COMPREHENSIVE METABOLIC PANEL
ALT: 97 U/L — ABNORMAL HIGH (ref 0–44)
AST: 38 U/L (ref 15–41)
Albumin: 2.7 g/dL — ABNORMAL LOW (ref 3.5–5.0)
Alkaline Phosphatase: 143 U/L — ABNORMAL HIGH (ref 38–126)
Anion gap: 11 (ref 5–15)
BUN: 10 mg/dL (ref 6–20)
CO2: 26 mmol/L (ref 22–32)
Calcium: 8.6 mg/dL — ABNORMAL LOW (ref 8.9–10.3)
Chloride: 102 mmol/L (ref 98–111)
Creatinine, Ser: 1.38 mg/dL — ABNORMAL HIGH (ref 0.61–1.24)
GFR calc Af Amer: >60 mL/min (ref >60)
GFR calc non Af Amer: >60 mL/min (ref >60)
Glucose, Bld: 138 mg/dL — ABNORMAL HIGH (ref 70–99)
Potassium: 3.5 mmol/L (ref 3.5–5.1)
Sodium: 139 mmol/L (ref 135–145)
Total Bilirubin: 0.7 mg/dL (ref 0.3–1.2)
Total Protein: 6.2 g/dL — ABNORMAL LOW (ref 6.5–8.1)

## 2019-05-17 SURGERY — ECHOCARDIOGRAM, TRANSESOPHAGEAL
Anesthesia: Monitor Anesthesia Care

## 2019-05-17 MED ORDER — MINERAL OIL RE ENEM
1.0000 | ENEMA | Freq: Once | RECTAL | Status: AC
  Administered 2019-05-17: 19:00:00 1 via RECTAL
  Filled 2019-05-17: qty 1

## 2019-05-17 MED ORDER — SODIUM CHLORIDE 0.9 % IV SOLN: INTRAVENOUS | Status: DC

## 2019-05-17 MED ORDER — PREGABALIN 100 MG PO CAPS
100.0000 mg | ORAL_CAPSULE | Freq: Three times a day (TID) | ORAL | Status: DC
  Administered 2019-05-17 – 2019-05-19 (×6): 100 mg via ORAL
  Filled 2019-05-17 (×6): qty 1

## 2019-05-17 MED ORDER — NICOTINE 14 MG/24HR TD PT24
14.0000 mg | MEDICATED_PATCH | Freq: Every day | TRANSDERMAL | Status: DC
  Administered 2019-05-17 – 2019-05-19 (×3): 14 mg via TRANSDERMAL
  Filled 2019-05-17 (×3): qty 1

## 2019-05-17 MED ORDER — LINEZOLID 600 MG PO TABS: 600.0000 mg | ORAL_TABLET | Freq: Two times a day (BID) | ORAL | 0 refills | Status: DC

## 2019-05-17 MED ORDER — PROPOFOL 500 MG/50ML IV EMUL
INTRAVENOUS | Status: DC | PRN
  Administered 2019-05-17: 150 ug/kg/min via INTRAVENOUS

## 2019-05-17 MED ORDER — PROPOFOL 10 MG/ML IV BOLUS
INTRAVENOUS | Status: DC | PRN
  Administered 2019-05-17: 20 mg via INTRAVENOUS
  Administered 2019-05-17 (×2): 40 mg via INTRAVENOUS

## 2019-05-17 MED ORDER — LACTATED RINGERS IV SOLN: INTRAVENOUS | Status: DC | PRN

## 2019-05-17 MED ORDER — CEFAZOLIN SODIUM-DEXTROSE 2-4 GM/100ML-% IV SOLN
2.0000 g | Freq: Three times a day (TID) | INTRAVENOUS | Status: DC
  Administered 2019-05-17 – 2019-05-19 (×7): 2 g via INTRAVENOUS
  Filled 2019-05-17 (×11): qty 100

## 2019-05-17 MED ORDER — BUTAMBEN-TETRACAINE-BENZOCAINE 2-2-14 % EX AERO
INHALATION_SPRAY | CUTANEOUS | Status: DC | PRN
  Administered 2019-05-17: 2 via TOPICAL

## 2019-05-17 NOTE — Progress Notes (Signed)
Pharmacy Antibiotic Note  George Burke is a 44 y.o. male admitted on 05/13/2019 with Staph Bacteremia.  Pharmacy has been consulted for Ancef dosing.  ID: Abx D#5 for bacteremia. - Tmax/24h: 101.2, WBC 10.3, SCr 1.38, LA 1.1 (1/15) - TTE obtained and negative, but valves were not well visualized. - 1/19: TEE-IP  Vanc 1/15>> 1/19 Zosyn x 1 1/5 Ancef 1/19>>  1/12 Blood >> single set but in both bottles - CoNS - Lab to to sensitivities 1/15 BCx >> Staph epidermidis, oxacillin sensitive 1/16 BCx >> Staph epidermidis  Called lab on 1/18 - appears to be the same organism in all cultures - a coag neg staph.     Plan: - d/c Vancomycin - Start Ancef 2g IV q8hr. - f/u TEE results- IP    Weight: 198 lb (89.8 kg)  Temp (24hrs), Avg:99.5 F (37.5 C), Min:98.6 F (37 C), Max:101.2 F (38.4 C)  Recent Labs  Lab 05/13/19 1013 05/13/19 1320 05/14/19 0418 05/15/19 0405 05/16/19 0311 05/17/19 0326  WBC 6.6  --  8.6 10.3 10.0 10.3  CREATININE 1.70*  --  1.44* 1.27* 1.42* 1.38*  LATICACIDVEN 0.9 1.1  --   --   --   --     Estimated Creatinine Clearance: 75 mL/min (A) (by C-G formula based on SCr of 1.38 mg/dL (H)).    No Known Allergies   Aliyanna Wassmer S. Merilynn Finland, PharmD, BCPS Clinical Staff Pharmacist [email or web address] Pasty Spillers 05/17/2019 12:16 PM

## 2019-05-17 NOTE — H&P (Signed)
Procedural H&P:  Mr. Vilardi presents to endoscopy for transesophageal echocardiogram in the setting of blood cultures positive for GPC's.  Patient is largely asymptomatic, denies chest pain, shortness of breath.  Transthoracic echocardiogram unremarkable for source of persistent bacteremia.  Physical exam General: No acute distress, ambulating around the room for our discussion, cardiovascular regular rhythm normal rate no apparent murmurs.  Lungs clear, abdomen soft, extremities warm, no edema.  Neuro/psych: Alert and oriented x3, normal mood and affect.  The patient and I discussed the indications for transesophageal echocardiogram in the setting of bacteremia.  We discussed procedural risks, benefits, alternatives and participated and informed consent.  After careful review of history and examination, the risks and benefits of transesophageal echocardiogram have been explained including risks of esophageal damage, perforation (1:10,000 risk), bleeding, pharyngeal hematoma as well as other potential complications associated with conscious sedation including aspiration, arrhythmia, respiratory failure and death. Alternatives to treatment were discussed, questions were answered. Patient is willing to proceed.   Parke Poisson 05/17/19 9:12 AM

## 2019-05-17 NOTE — Anesthesia Preprocedure Evaluation (Signed)
Anesthesia Evaluation  Patient identified by MRN, date of birth, ID band Patient awake    Reviewed: Allergy & Precautions, NPO status , Patient's Chart, lab work & pertinent test results, reviewed documented beta blocker date and time   Airway Mallampati: II  TM Distance: >3 FB Neck ROM: Full    Dental  (+) Dental Advisory Given, Poor Dentition, Missing   Pulmonary Current Smoker and Patient abstained from smoking.,    Pulmonary exam normal breath sounds clear to auscultation       Cardiovascular hypertension, Pt. on medications and Pt. on home beta blockers Normal cardiovascular exam Rhythm:Regular Rate:Normal     Neuro/Psych Seizures -,  CVA, No Residual Symptoms    GI/Hepatic negative GI ROS, Neg liver ROS,   Endo/Other  negative endocrine ROS  Renal/GU negative Renal ROS     Musculoskeletal negative musculoskeletal ROS (+)   Abdominal   Peds  Hematology negative hematology ROS (+) Bacteremia    Anesthesia Other Findings Day of surgery medications reviewed with the patient.  Reproductive/Obstetrics                             Anesthesia Physical Anesthesia Plan  ASA: III  Anesthesia Plan: MAC   Post-op Pain Management:    Induction: Intravenous  PONV Risk Score and Plan: 0 and Propofol infusion and Treatment may vary due to age or medical condition  Airway Management Planned: Nasal Cannula and Natural Airway  Additional Equipment:   Intra-op Plan:   Post-operative Plan:   Informed Consent: I have reviewed the patients History and Physical, chart, labs and discussed the procedure including the risks, benefits and alternatives for the proposed anesthesia with the patient or authorized representative who has indicated his/her understanding and acceptance.     Dental advisory given  Plan Discussed with: CRNA and Anesthesiologist  Anesthesia Plan Comments:          Anesthesia Quick Evaluation

## 2019-05-17 NOTE — Anesthesia Postprocedure Evaluation (Signed)
Anesthesia Post Note  Patient: George Burke  Procedure(s) Performed: TRANSESOPHAGEAL ECHOCARDIOGRAM (TEE) (N/A ) BUBBLE STUDY     Patient location during evaluation: PACU Anesthesia Type: MAC Level of consciousness: awake and alert Pain management: pain level controlled Vital Signs Assessment: post-procedure vital signs reviewed and stable Respiratory status: spontaneous breathing, nonlabored ventilation and respiratory function stable Cardiovascular status: stable and blood pressure returned to baseline Postop Assessment: no apparent nausea or vomiting Anesthetic complications: no    Last Vitals:  Vitals:   05/17/19 1000 05/17/19 1010  BP: (!) 154/90 (!) 152/91  Pulse: 79 86  Resp: 18 (!) 23  Temp:    SpO2: 97% 100%    Last Pain:  Vitals:   05/17/19 1010  TempSrc:   PainSc: 0-No pain                 Catalina Gravel

## 2019-05-17 NOTE — Progress Notes (Signed)
I discussed updates with the patient and his GF at beside this afternoon. We went over the TEE results and why the diagnosis of endocarditis warrants more aggressive treatment. We also discussed the susceptibles and transition of Vancomycin to Ancef. I ensured that PICC placement will not make patient fully dependent on others, as he can still go to work and care for himself, but will need to be extremely careful in regards to lifting heavy objects and lifting anything too high. Patient and family expressed understanding and agreement to plan at this time.   In addition, George Burke continues to have significant constipation, which is causing worsening abdominal pain. He is willing to try a heavier duty enema, such as mineral oil enema. If unsuccessful, plan to try SMOG enema.   He notes that he is a approximately 12 cigarette per day smoker and has been having cravings. I encouraged that he try a nicotine patch rather than leave to have a cigarette. He agrees to not leave for cigarette.   We discussed pain management plan. He emphasizes that Gabapentin has not alleviated any of this pain. He is willing to try Lyrica given that he knows his pain is neurogenic from S1 impingement.    Plan:  - Nicotine patch 14 mg QD - Mineral oil enema once  - Lyrica 100 mg TID - Discontinue Gabapentin

## 2019-05-17 NOTE — Progress Notes (Signed)
  Echocardiogram 2D Echocardiogram has been performed.  George Burke 05/17/2019, 9:56 AM

## 2019-05-17 NOTE — Consult Note (Signed)
[facility]    Date of Admission:  05/13/2019     Total days of antibiotics 5               Reason for Consult: Endocarditis    Referring Provider: Evette Doffing Primary Care Provider: Patient, No Pcp Per   ASSESSMENT:  George Burke is a 44 y/o male with Staphylococcus epidermidis bacteremia complicated with mitral valve endocarditis. Source of infection is unclear at present although he notes having a bite on the side of his neck and cannot exclude from drug use or oral origins.  Repeat blood cultures are in process. Agree with change of antibiotic to Cefazolin given oxacillin sensitivity. He will need prolonged therapy for endocarditis for at least 6 weeks ideally with Cefazolin. Discussed that it will be at least 48 hours from his last blood cultures this morning to determine safety for PICC line insertion. If he were to decide that he wishes to leave AMA would recommend Linezolid for 6 weeks with monitoring of lab work every 2 weeks for thrombocytopenia.   PLAN:  1. Continue Cefazolin 2. Hold PICC placement until blood cultures are cleared for at least 48 hours. 3. If he decides to leave AMA then recommend Linezolid for 6 weeks which has been arranged with the ID pharmacy if needed.   Active Problems:   Bacteremia due to coagulase-negative Staphylococcus   Endocarditis of mitral valve   . amLODipine  10 mg Oral Daily  . enoxaparin (LOVENOX) injection  40 mg Subcutaneous Q24H  . gabapentin  600 mg Oral TID  . levETIRAcetam  500 mg Oral BID  . polyethylene glycol  17 g Oral BID  . polyethylene glycol  17 g Oral Once  . senna  2 tablet Oral BID  . traZODone  50 mg Oral QHS     HPI: George Burke is a 44 y.o. male with previous medical history of hypertension, ICH and polysubstance abuse admitted to the hospital with fever and chills.   George Burke was initially seen in the ED on 05/10/2019 and had blood drawn but left without being seen. Blood  cultures drawn were positive for Staphylococcus epidermidis in 4/4 bottles. He started feeling ill about 5 days prior to presentation experiencing nausea and malaise in addition to fever and left-sided lumbar back pain radiating to his hip. He also had been having night sweats, fevers, and chills. No recent IV drug use, but has inhaled cocaine within the last 3 weeks.   In the ED temperature was 99.5 and he was tachycardic. Blood work with creatinine of 1.70. X-ray chest without abnormality. MRI lumbar spine without evidence of abscess or osteomyelitis. Initially started on vancomycin and piperacillin-tazobactam. Sensitivities show oxacillin sensitivity and was changed to cefazolin today. No clear vegetation was seen on TTE. TEE performed show tiny, mobile echodensity on the posterior mitral valve measuring 0.5 cm with preserved structure and function.   George Burke has been febrile to 101.2 in the last 24 hours. Most recent blood cultures from 05/17/19 are pending. Repeat cultures from 05/14/19 are positive for Staphylococcus epidermidis.   George Burke recalls being bitten in the neck by something prior to the start of his malaise and fevers that progressively worsened. Fevers were as high as 103. He has not been on any recent antibiotics. No previous history of IV drug use, however has used cocaine.    Review of Systems: Review of Systems  Constitutional: Negative for chills, fever and  weight loss.  Respiratory: Negative for cough, shortness of breath and wheezing.   Cardiovascular: Negative for chest pain and leg swelling.  Gastrointestinal: Negative for abdominal pain, constipation, diarrhea, nausea and vomiting.  Musculoskeletal: Positive for back pain.  Skin: Negative for rash.     Past Medical History:  Diagnosis Date  . Hypertension   . Stroke [facility])     Social History   Tobacco Use  . Smoking status: Current Every Day Smoker    Types: Cigarettes  . Smokeless tobacco: Never Used   Substance Use Topics  . Alcohol use: Yes  . Drug use: Yes    Types: Cocaine    Comment: rare use about a month ago     No family history on file.  No Known Allergies  OBJECTIVE: Blood pressure 98/72, pulse 94, temperature 98.1 F (36.7 C), temperature source Axillary, resp. rate 20, weight 89.8 kg, SpO2 98 %.  Physical Exam Constitutional:      General: He is not in acute distress.    Appearance: He is well-developed.  Cardiovascular:     Rate and Rhythm: Normal rate and regular rhythm.     Heart sounds: Normal heart sounds.  Pulmonary:     Effort: Pulmonary effort is normal.     Breath sounds: Normal breath sounds.  Skin:    General: Skin is warm and dry.  Neurological:     Mental Status: He is alert and oriented to person, place, and time.  Psychiatric:        Behavior: Behavior normal.        Thought Content: Thought content normal.        Judgment: Judgment normal.     Lab Results Lab Results  Component Value Date   WBC 10.3 05/17/2019   HGB 13.7 05/17/2019   HCT 39.5 05/17/2019   MCV 90.0 05/17/2019   PLT 418 (H) 05/17/2019    Lab Results  Component Value Date   CREATININE 1.38 (H) 05/17/2019   BUN 10 05/17/2019   NA 139 05/17/2019   K 3.5 05/17/2019   CL 102 05/17/2019   CO2 26 05/17/2019    Lab Results  Component Value Date   ALT 97 (H) 05/17/2019   AST 38 05/17/2019   ALKPHOS 143 (H) 05/17/2019   BILITOT 0.7 05/17/2019     Microbiology: Recent Results (from the past 240 hour(s))  Culture, blood (single)     Status: Abnormal   Collection Time: 05/10/19 11:36 AM   Specimen: BLOOD  Result Value Ref Range Status   Specimen Description BLOOD RIGHT ANTECUBITAL  Final   Special Requests   Final    BOTTLES DRAWN AEROBIC AND ANAEROBIC Blood Culture adequate volume   Culture  Setup Time   Final    IN BOTH AEROBIC AND ANAEROBIC BOTTLES GRAM POSITIVE COCCI CRITICAL RESULT CALLED TO, READ BACK BY AND VERIFIED WITH: Marjory Sneddon RN 05/11/19 1939 JDW     Culture (A)  Final    STAPHYLOCOCCUS SPECIES (COAGULASE NEGATIVE) THE SIGNIFICANCE OF ISOLATING THIS ORGANISM FROM A SINGLE SET OF BLOOD CULTURES WHEN MULTIPLE SETS ARE DRAWN IS UNCERTAIN. PLEASE NOTIFY THE MICROBIOLOGY DEPARTMENT WITHIN ONE WEEK IF SPECIATION AND SENSITIVITIES ARE REQUIRED. Performed at [facility], [address] [place], [place] [address]    Report Status 05/14/2019 FINAL  Final  Respiratory Panel by RT PCR (Flu A&B, Covid) - Nasopharyngeal Swab     Status: None   Collection Time: 05/13/19 10:14 AM   Specimen: Nasopharyngeal  Swab  Result Value Ref Range Status   SARS Coronavirus 2 by RT PCR NEGATIVE NEGATIVE Final    Comment: (NOTE) SARS-CoV-2 target nucleic acids are NOT DETECTED. The SARS-CoV-2 RNA is generally detectable in upper respiratoy specimens during the acute phase of infection. The lowest concentration of SARS-CoV-2 viral copies this assay can detect is 131 copies/mL. A negative result does not preclude SARS-Cov-2 infection and should not be used as the sole basis for treatment or other patient management decisions. A negative result may occur with  improper specimen collection/handling, submission of specimen other than nasopharyngeal swab, presence of viral mutation(s) within the areas targeted by this assay, and inadequate number of viral copies (<131 copies/mL). A negative result must be combined with clinical observations, patient history, and epidemiological information. The expected result is Negative. Fact Sheet for Patients:  https://www.moore.com/ Fact Sheet for Healthcare Providers:  https://www.young.biz/ This test is not yet ap proved or cleared by the [place] FDA and  has been authorized for detection and/or diagnosis of SARS-CoV-2 by FDA under an Emergency Use Authorization (EUA). This EUA will remain  in effect (meaning this test can be used) for the duration of the COVID-19  declaration under Section 564(b)(1) of the Act, 21 U.S.C. section 360bbb-3(b)(1), unless the authorization is terminated or revoked sooner.    Influenza A by PCR NEGATIVE NEGATIVE Final   Influenza B by PCR NEGATIVE NEGATIVE Final    Comment: (NOTE) The Xpert Xpress SARS-CoV-2/FLU/RSV assay is intended as an aid in  the diagnosis of influenza from Nasopharyngeal swab specimens and  should not be used as a sole basis for treatment. Nasal washings and  aspirates are unacceptable for Xpert Xpress SARS-CoV-2/FLU/RSV  testing. Fact Sheet for Patients: https://www.moore.com/ Fact Sheet for Healthcare Providers: https://www.young.biz/ This test is not yet approved or cleared by the [place] FDA and  has been authorized for detection and/or diagnosis of SARS-CoV-2 by  FDA under an Emergency Use Authorization (EUA). This EUA will remain  in effect (meaning this test can be used) for the duration of the  Covid-19 declaration under Section 564(b)(1) of the Act, 21  U.S.C. section 360bbb-3(b)(1), unless the authorization is  terminated or revoked. Performed at [facility], [address] [place], [place] [address]   Culture, blood (routine x 2)     Status: Abnormal   Collection Time: 05/13/19 11:25 AM   Specimen: BLOOD LEFT HAND  Result Value Ref Range Status   Specimen Description BLOOD LEFT HAND  Final   Special Requests   Final    BOTTLES DRAWN AEROBIC AND ANAEROBIC Blood Culture results may not be optimal due to an inadequate volume of blood received in culture bottles   Culture  Setup Time   Final    GRAM POSITIVE COCCI IN BOTH AEROBIC AND ANAEROBIC BOTTLES CRITICAL RESULT CALLED TO, READ BACK BY AND VERIFIED WITH: PHARMD H BAIRD [id number] AT 951 BY CM Performed at [facility], [address] [place], [place] [address]    Culture STAPHYLOCOCCUS EPIDERMIDIS (A)  Final   Report Status 05/17/2019 FINAL  Final   Organism ID,  Bacteria STAPHYLOCOCCUS EPIDERMIDIS  Final      Susceptibility   Staphylococcus epidermidis - MIC*    CIPROFLOXACIN <=0.5 SENSITIVE Sensitive     ERYTHROMYCIN <=0.25 SENSITIVE Sensitive     GENTAMICIN <=0.5 SENSITIVE Sensitive     OXACILLIN <=0.25 SENSITIVE Sensitive     TETRACYCLINE <=1 SENSITIVE Sensitive     VANCOMYCIN 1  SENSITIVE Sensitive     TRIMETH/SULFA <=10 SENSITIVE Sensitive     CLINDAMYCIN <=0.25 SENSITIVE Sensitive     RIFAMPIN <=0.5 SENSITIVE Sensitive     Inducible Clindamycin NEGATIVE Sensitive     * STAPHYLOCOCCUS EPIDERMIDIS  Culture, blood (routine x 2)     Status: Abnormal   Collection Time: 05/13/19 11:26 AM   Specimen: BLOOD  Result Value Ref Range Status   Specimen Description BLOOD LEFT ANTECUBITAL  Final   Special Requests   Final    BOTTLES DRAWN AEROBIC AND ANAEROBIC Blood Culture results may not be optimal due to an inadequate volume of blood received in culture bottles   Culture  Setup Time   Final    GRAM POSITIVE COCCI IN CLUSTERS IN BOTH AEROBIC AND ANAEROBIC BOTTLES CRITICAL RESULT CALLED TO, READ BACK BY AND VERIFIED WITH: TRubin Payor PHARMD, AT 0949 05/16/19 BY D. VANHOOK    Culture (A)  Final    STAPHYLOCOCCUS EPIDERMIDIS SUSCEPTIBILITIES PERFORMED ON PREVIOUS CULTURE WITHIN THE LAST 5 DAYS. Performed at [facility], [address], [place], [place] [address]    Report Status 05/17/2019 FINAL  Final  Culture, blood (routine x 2)     Status: Abnormal   Collection Time: 05/14/19  1:44 PM   Specimen: BLOOD RIGHT ARM  Result Value Ref Range Status   Specimen Description BLOOD RIGHT ARM  Final   Special Requests   Final    BOTTLES DRAWN AEROBIC AND ANAEROBIC Blood Culture adequate volume   Culture  Setup Time   Final    GRAM POSITIVE COCCI IN CLUSTERS IN BOTH AEROBIC AND ANAEROBIC BOTTLES CRITICAL VALUE NOTED.  VALUE IS CONSISTENT WITH PREVIOUSLY REPORTED AND CALLED VALUE.    Culture (A)  Final    STAPHYLOCOCCUS  EPIDERMIDIS SUSCEPTIBILITIES PERFORMED ON PREVIOUS CULTURE WITHIN THE LAST 5 DAYS. Performed at [facility], [address] [place], [place] [address]    Report Status 05/17/2019 FINAL  Final  Culture, blood (routine x 2)     Status: Abnormal   Collection Time: 05/14/19  1:44 PM   Specimen: BLOOD LEFT ARM  Result Value Ref Range Status   Specimen Description BLOOD LEFT ARM  Final   Special Requests   Final    BOTTLES DRAWN AEROBIC AND ANAEROBIC Blood Culture adequate volume   Culture  Setup Time   Final    GRAM POSITIVE COCCI ANAEROBIC BOTTLE ONLY CRITICAL VALUE NOTED.  VALUE IS CONSISTENT WITH PREVIOUSLY REPORTED AND CALLED VALUE.    Culture (A)  Final    STAPHYLOCOCCUS EPIDERMIDIS SUSCEPTIBILITIES PERFORMED ON PREVIOUS CULTURE WITHIN THE LAST 5 DAYS. Performed at [facility], [address] [place], [place] [address]    Report Status 05/17/2019 FINAL  Final     Marcos Eke, NP [facility] [phone] Pager  05/17/2019  3:38 PM

## 2019-05-17 NOTE — Progress Notes (Signed)
Pt transfer to ultrasound, NPO midnight, alert and oriented, wears gown, on wheelchair, no complain of pain at this time, then will transfer to TEE with consent.

## 2019-05-17 NOTE — Progress Notes (Signed)
   Subjective:   [name] reports the procedure went well this AM. He was told that an abnormality was found and was wondering about how it will change plan. He denies any BM since trying enema and is willing to try an enema again but is concern about excessive diarrhea as a side effect.   Objective:  Vital signs in last 24 hours: Vitals:   05/16/19 0537 05/16/19 1440 05/16/19 2039 05/17/19 0456  BP: (!) 147/103 (!) 142/90 (!) 139/97 (!) 146/103  Pulse: 97 94 (!) 102 89  Resp: 18 18 16 16   Temp: 98.9 F (37.2 C) 99.1 F (37.3 C) 98.6 F (37 C) 98.8 F (37.1 C)  TempSrc: Oral Oral Oral Oral  SpO2: 98% 98% 100% 98%  Weight:        Physical Exam Constitutional:      General: He is not in acute distress.    Appearance: He is normal weight.  Pulmonary:     Effort: Pulmonary effort is normal. No respiratory distress.  Neurological:     Mental Status: He is alert and oriented to person, place, and time. Mental status is at baseline.     Gait: Gait normal.  Psychiatric:        Mood and Affect: Mood normal.        Behavior: Behavior normal.    Assessment/Plan:  Active Problems:   Bacteremia due to coagulase-negative Staphylococcus  [name] is a 44 y/o male with a PMHx of HTN, epilepsy (last seizure in 03/2020), ICH withresolvedright hemiparesis (2018), polysubstance abusewho is currently admitted for staph bacteremia after presenting with fever, chills.  # Coagulase Negative Staph Bacteremia Blood cultures:  1/12: Staph epidermis 2/2 bottles 1/15: Staph epidermis1/4 bottles, inadequate blood quantity though 1/16: Staph epidermis 1/4 bottles 1/19: BCx redrawn and pending   TEE today showed a vegetation measuring 0.5 cm on the mitral valve, as well as another vegetation vs prolapse on the neighboring leaflet of the mitral valve. ID consulted for PICC and antibiotic regimen. Will need negative cultures before PICC can be placed. Susceptibilities showed  Oxacillin sensitive, so Vancomycin was switched for Ancef.   - ID consulted and on board  -Ancef dosing per pharmacy - Blood cultures pending  - CBC in the AM  -TylenolPRN for fever   # Left Lumbar Pain MRIrevealed possible left-sided S1 nerve compression. Gabapentin has not been controlling pain at all, hence patient has been requiring Oxycodone q6h. Will continue with current regimen at this time.   - Gabapentin increased to 600 mg TID - Oxycodone 5 mg q6  #Constipation KUB showed moderate stool burden. Enema yesterday was unsuccessful per patient. He is willing to consider trying enema again if without relief today.   - SennaBID - MiralaxBID   # Elevated LFTs Improving today with RUQ ultrasound negative for abnormalities. Likely related to acute bacteremia. Will continue to trend labs to ensure resolution.   - CMP  Dispo: Anticipated dischargepending further medical work-up.Plan to discharge home afterwards.   Dr. 2/19 Internal Medicine PGY-1  Pager: [phone] 05/17/2019, 6:51 AM

## 2019-05-17 NOTE — CV Procedure (Signed)
INDICATIONS: infective endocarditis  PROCEDURE:   Informed consent was obtained prior to the procedure. The risks, benefits and alternatives for the procedure were discussed and the patient comprehended these risks.  Risks include, but are not limited to, cough, sore throat, vomiting, nausea, somnolence, esophageal and stomach trauma or perforation, bleeding, low blood pressure, aspiration, pneumonia, infection, trauma to the teeth and death.    After a procedural time-out, the oropharynx was anesthetized with 20% benzocaine spray.   During this procedure the patient was administered propofol per anesthesia for moderate sedation.  The patient's heart rate, blood pressure, and oxygen saturation were monitored continuously during the procedure. The period of conscious sedation was 30 minutes, of which I was present face-to-face 100% of this time. Probe insterted  The transesophageal probe was inserted in the esophagus and stomach without difficulty and multiple views were obtained.  The patient was kept under observation until the patient left the procedure room.  The patient left the procedure room in stable condition.   Agitated microbubble saline contrast was administered.  COMPLICATIONS:    There were no immediate complications.  FINDINGS:  1. Tiny, mobile echodensity on the atrial aspect of the P2 scallop of the posterior mitral valve leaflet measuring 0.5 cm, demonstrating independent motion. In the setting of bacteremia, this lesion may represent endocarditis. The P3 scallop has mild  prolapse, with a small indepedently mobile echodensity, which may represent endocarditis vs flail component. Trivial mitral valve regurgitation.  2. Left ventricular ejection fraction, by visual estimation, is 60 to 65%. The left ventricle has normal function. There is no left ventricular hypertrophy.  3. Global right ventricle has normal systolic function.The right ventricular size is normal. No increase in  right ventricular wall thickness.  4. The aortic valve is tricuspid. Aortic valve regurgitation is not visualized. No evidence of aortic valve sclerosis or stenosis.  5. The pulmonic valve was grossly normal. Pulmonic valve regurgitation is not visualized.  6. The tricuspid valve is normal in structure. Tricuspid valve regurgitation is trivial.   RECOMMENDATIONS:    Can return to hospital room when alert.  Time Spent Directly with the Patient:  60 minutes   Parke Poisson 05/17/2019, 9:49 AM

## 2019-05-17 NOTE — Transfer of Care (Signed)
Immediate Anesthesia Transfer of Care Note  Patient: TAISON CELANI  Procedure(s) Performed: TRANSESOPHAGEAL ECHOCARDIOGRAM (TEE) (N/A ) BUBBLE STUDY  Patient Location: Endoscopy Unit  Anesthesia Type:MAC  Level of Consciousness: drowsy  Airway & Oxygen Therapy: Patient Spontanous Breathing and Patient connected to nasal cannula oxygen  Post-op Assessment: Report given to RN and Post -op Vital signs reviewed and stable  Post vital signs: Reviewed and stable  Last Vitals:  Vitals Value Taken Time  BP    Temp    Pulse 80 05/17/19 0954  Resp 17 05/17/19 0954  SpO2 99 % 05/17/19 0954  Vitals shown include unvalidated device data.  Last Pain:  Vitals:   05/17/19 0847  TempSrc: Temporal  PainSc: 6       Patients Stated Pain Goal: 0 (80/22/33 6122)  Complications: No apparent anesthesia complications

## 2019-05-17 NOTE — Care Management (Signed)
Pres[facility] for Zyvox [facility]. NCM entered patient in [facility] program for zero co pay. [facility] pharmacy will fill prescription and leave it in main pharmacy. Bedside nurse Bernie aware.  Ronny Flurry RN

## 2019-05-17 NOTE — Plan of Care (Signed)
  Problem: Education: Goal: Knowledge of General Education information will improve Description Including pain rating scale, medication(s)/side effects and non-pharmacologic comfort measures Outcome: Progressing   

## 2019-05-18 ENCOUNTER — Inpatient Hospital Stay (HOSPITAL_COMMUNITY): Payer: Self-pay

## 2019-05-18 LAB — COMPREHENSIVE METABOLIC PANEL
ALT: 71 U/L — ABNORMAL HIGH (ref 0–44)
AST: 24 U/L (ref 15–41)
Albumin: 2.9 g/dL — ABNORMAL LOW (ref 3.5–5.0)
Alkaline Phosphatase: 123 U/L (ref 38–126)
Anion gap: 11 (ref 5–15)
BUN: 13 mg/dL (ref 6–20)
CO2: 28 mmol/L (ref 22–32)
Calcium: 8.7 mg/dL — ABNORMAL LOW (ref 8.9–10.3)
Chloride: 101 mmol/L (ref 98–111)
Creatinine, Ser: 1.42 mg/dL — ABNORMAL HIGH (ref 0.61–1.24)
GFR calc Af Amer: >60 mL/min (ref >60)
GFR calc non Af Amer: >60 mL/min (ref >60)
Glucose, Bld: 105 mg/dL — ABNORMAL HIGH (ref 70–99)
Potassium: 4.1 mmol/L (ref 3.5–5.1)
Sodium: 140 mmol/L (ref 135–145)
Total Bilirubin: 0.7 mg/dL (ref 0.3–1.2)
Total Protein: 6.7 g/dL (ref 6.5–8.1)

## 2019-05-18 LAB — CBC WITH DIFFERENTIAL/PLATELET
Abs Immature Granulocytes: 0.37 10*3/uL — ABNORMAL HIGH (ref 0.00–0.07)
Basophils Absolute: 0.1 10*3/uL (ref 0.0–0.1)
Basophils Relative: 1 %
Eosinophils Absolute: 0.2 10*3/uL (ref 0.0–0.5)
Eosinophils Relative: 2 %
HCT: 40 % (ref 39.0–52.0)
Hemoglobin: 13.4 g/dL (ref 13.0–17.0)
Immature Granulocytes: 3 %
Lymphocytes Relative: 18 %
Lymphs Abs: 2.1 10*3/uL (ref 0.7–4.0)
MCH: 30.8 pg (ref 26.0–34.0)
MCHC: 33.5 g/dL (ref 30.0–36.0)
MCV: 92 fL (ref 80.0–100.0)
Monocytes Absolute: 1.7 10*3/uL — ABNORMAL HIGH (ref 0.1–1.0)
Monocytes Relative: 15 %
Neutro Abs: 6.9 10*3/uL (ref 1.7–7.7)
Neutrophils Relative %: 61 %
Platelets: 508 10*3/uL — ABNORMAL HIGH (ref 150–400)
RBC: 4.35 MIL/uL (ref 4.22–5.81)
RDW: 13.1 % (ref 11.5–15.5)
WBC: 11.3 10*3/uL — ABNORMAL HIGH (ref 4.0–10.5)
nRBC: 0 % (ref 0.0–0.2)

## 2019-05-18 MED ORDER — POLYETHYLENE GLYCOL 3350 17 G PO PACK
17.0000 g | PACK | ORAL | Status: DC
  Administered 2019-05-18 – 2019-05-19 (×5): 17 g via ORAL
  Filled 2019-05-18 (×6): qty 1

## 2019-05-18 MED ORDER — SORBITOL 70 % SOLN
960.0000 mL | TOPICAL_OIL | Freq: Once | ORAL | Status: AC
  Administered 2019-05-18: 960 mL via RECTAL
  Filled 2019-05-18: qty 473

## 2019-05-18 NOTE — Plan of Care (Signed)
  Problem: Education: Goal: Knowledge of General Education information will improve Description: Including pain rating scale, medication(s)/side effects and non-pharmacologic comfort measures Outcome: Progressing   Problem: Health Behavior/Discharge Planning: Goal: Ability to manage health-related needs will improve Outcome: Progressing   Problem: Clinical Measurements: Goal: Ability to maintain clinical measurements within normal limits will improve Outcome: Progressing Goal: Will remain free from infection Outcome: Progressing Goal: Diagnostic test results will improve Outcome: Progressing   Problem: Activity: Goal: Risk for activity intolerance will decrease Outcome: Progressing   Problem: Nutrition: Goal: Adequate nutrition will be maintained Outcome: Progressing   Problem: Pain Managment: Goal: General experience of comfort will improve Outcome: Progressing   Problem: Safety: Goal: Ability to remain free from injury will improve Outcome: Progressing   Problem: Skin Integrity: Goal: Risk for impaired skin integrity will decrease Outcome: Progressing   

## 2019-05-18 NOTE — TOC Initial Note (Addendum)
Transition of Care [facility]) - Initial/Assessment Note    Patient Details  Name: George Burke MRN: [id number] Date of Birth: [birth year]  Transition of Care [facility]) CM/SW Contact:    George Plan, RN Phone Number: 05/18/2019, 1:05 PM  Clinical Narrative:                  Discussed home IV ABX with patient and girlfriend George Burke at bedside.   Confirmed face sheet information. Patient lives alone.   Patient and George Burke aware NCM will refer to [facility] and [facility] for charity IV Infusion and HHRN.   Explained patient and George Burke will be taught prior to discharge on how to administer IV ABX at home.  Home health RN will not be present every time a dose is due.  Patient and George Burke voice understanding.   Patient does not have a PCP. Dr George Burke with Internal Medicine has agreed to see him post discharge. Patient already entered in [facility] program for PO medications at discharge will be filled through [facility]. Zyvox PO already filled through [facility] and in main pharmacy.   Referral made to George Burke with [facility] ( agency assisting patient's with no insurance this week), George Burke declined referral due to staffing. Will discuss with [facility] leadership and Dr George Burke in [facility] meeting today .  Left message for George Burke with [facility].        Patient Goals and CMS Choice Patient states their goals for this hospitalization and ongoing recovery are:: to return to home CMS Medicare.gov Compare Post Acute Care list provided to:: Patient Choice offered to / list presented to : Patient  Expected Discharge Burke and Services   In-house Referral: Financial Counselor Discharge Planning Services: Indigent Health Clinic, MATCH Program, Medication Assistance Post Acute Care Choice: Home Health Living arrangements for the past 2 months: Apartment                 DME Arranged: N/A                    Prior Living Arrangements/Services Living arrangements for the past 2  months: Apartment   Patient language and need for interpreter reviewed:: Yes Do you feel safe going back to the place where you live?: Yes      Need for Family Participation in Patient Care: Yes (Comment) Care giver support system in place?: Yes (comment)   Criminal Activity/Legal Involvement Pertinent to Current Situation/Hospitalization: No - Comment as needed  Activities of Daily Living      Permission Sought/Granted   Permission granted to share information with : Yes, Verbal Permission Granted     Permission granted to share info w AGENCY: [facility], [facility] , girlfriend George Burke        Emotional Assessment Appearance:: Appears stated age Attitude/Demeanor/Rapport: Engaged Affect (typically observed): Accepting Orientation: : Oriented to Self, Oriented to Place, Oriented to  Time, Oriented to Situation Alcohol / Substance Use: Not Applicable Psych Involvement: No (comment)  Admission diagnosis:  Bacteremia [R78.81] Bacteremia due to coagulase-negative Staphylococcus [R78.81, B95.7] Patient Active Problem List   Diagnosis Date Noted  . Endocarditis of mitral valve 05/17/2019  . Bacteremia due to coagulase-negative Staphylococcus 05/13/2019   PCP:  Patient, No Pcp Per Pharmacy:   [id number] [place], [place] - [address] [place] [address] Phone: [phone] Fax: [phone]  [facility] [place], [place] - [address]  [place] [address] Phone: [phone] Fax: [phone]     Social Determinants of Health (SDOH) Interventions    Readmission Risk Interventions No flowsheet data found.

## 2019-05-18 NOTE — Progress Notes (Addendum)
Subjective:   George Burke is feeling better after getting a good night's sleep. Still having some low back pain which improves when he gets up and walks around. He thinks Lyrica may be providing more relief than Gabapentin. No fevers or chills. Tolerating PO. No dental pain. Mineral oil enema yesterday was unsucessful, so we discussed escalating to SMOG enema this AM.   Objective:  Vital signs in last 24 hours: Vitals:   05/17/19 1010 05/17/19 1413 05/17/19 2154 05/18/19 0503  BP: (!) 152/91 98/72 (!) 148/83 (!) 143/87  Pulse: 86 94 95 96  Resp: (!) 23 20 18 18   Temp:  98.1 F (36.7 C) 99.6 F (37.6 C) 99.4 F (37.4 C)  TempSrc:  Axillary Oral Oral  SpO2: 100% 98% 100% 97%  Weight:        Physical Exam Vitals and nursing note reviewed.  Constitutional:      General: He is not in acute distress.    Appearance: He is normal weight.  Pulmonary:     Effort: Pulmonary effort is normal. No respiratory distress.  Musculoskeletal:     Thoracic back: Tenderness (left lateral muscle tenderness) present. No bony tenderness.     Lumbar back: Tenderness and bony tenderness present.  Skin:    General: Skin is warm and dry.     Findings: No bruising or ecchymosis.  Neurological:     Mental Status: He is alert and oriented to person, place, and time. Mental status is at baseline.  Psychiatric:        Mood and Affect: Mood normal.        Behavior: Behavior normal.    Assessment/Plan:  Principal Problem:   Bacteremia due to coagulase-negative Staphylococcus Active Problems:   Endocarditis of mitral valve  George Burke is a 44 y/o male with a PMHx of HTN, epilepsy (last seizure in 03/2020), ICH with resolved right hemiparesis (2018), polysubstance abuse who is currently admitted for staph bacteremia after presenting with fever, chills.    # Staph Epidermis Bacteremia  # Mitral Valve Endocarditis  TEE on 1/19 demonstrated evidence of small mitral valve vegetation. Repeat  blood cultures are NGTD but will need more time to allow for growth. PICC line placement likely in 1-2 days. Overall, will need 6 weeks of Ancef. Antibiotics were initiated on 05/12/2018 so last day of treatment tentatively on 06/23/2018. Will obtain Panorex for evaluation of any dental abscesses. CBC shows worsening leukocytosis despite appropriate treatment for bacteremia, so will continue to trend. Afebrile since AM of 1/19 and HDS.    - Appreciate ID consultation.  - Ancef dosing per pharmacy - Blood cultures pending  - CBC in the AM - Tylenol PRN for fever  - Panorex scan  - PICC placement pending clear blood cultures   # Left Lumbar Pain MRI revealed possible left-sided S1 nerve compression. Gabapentin has not been controlling pain at all, hence patient has been requiring Oxycodone q6h. Switched to Lyrica yesterday PM after discussing with patient, appears to be more effective at this time.    - Lyrica 100 mg TID  - Oxycodone 5 mg q6   # Constipation KUB showed moderate stool burden. Mineral oil enema yesterday unsuccessful. Patient agreed to try SMOG enema today, as constipation is causing a lot of abdominal pain.    - Senna BID - Miralax BID  - SMOG enema    # Elevated LFTs LFTs continued to improve and normalize today. Work up was unrevealing for acute causes at this  time. Likely related to bacteremia. Will continue to monitor for complete resolution.    - CMP   Dispo: Anticipated discharge pending successful PICC placement.   Dr. Verdene Lennert Internal Medicine PGY-1  Pager: [phone] 05/18/2019, 8:02 AM

## 2019-05-19 ENCOUNTER — Inpatient Hospital Stay: Payer: Self-pay

## 2019-05-19 LAB — CBC WITH DIFFERENTIAL/PLATELET
Abs Immature Granulocytes: 0.29 10*3/uL — ABNORMAL HIGH (ref 0.00–0.07)
Basophils Absolute: 0.1 10*3/uL (ref 0.0–0.1)
Basophils Relative: 1 %
Eosinophils Absolute: 0.2 10*3/uL (ref 0.0–0.5)
Eosinophils Relative: 1 %
HCT: 39.9 % (ref 39.0–52.0)
Hemoglobin: 13.4 g/dL (ref 13.0–17.0)
Immature Granulocytes: 2 %
Lymphocytes Relative: 14 %
Lymphs Abs: 1.9 10*3/uL (ref 0.7–4.0)
MCH: 30.7 pg (ref 26.0–34.0)
MCHC: 33.6 g/dL (ref 30.0–36.0)
MCV: 91.3 fL (ref 80.0–100.0)
Monocytes Absolute: 1.7 10*3/uL — ABNORMAL HIGH (ref 0.1–1.0)
Monocytes Relative: 13 %
Neutro Abs: 9.4 10*3/uL — ABNORMAL HIGH (ref 1.7–7.7)
Neutrophils Relative %: 69 %
Platelets: 592 10*3/uL — ABNORMAL HIGH (ref 150–400)
RBC: 4.37 MIL/uL (ref 4.22–5.81)
RDW: 13 % (ref 11.5–15.5)
WBC: 13.6 10*3/uL — ABNORMAL HIGH (ref 4.0–10.5)
nRBC: 0 % (ref 0.0–0.2)

## 2019-05-19 LAB — BASIC METABOLIC PANEL
Anion gap: 12 (ref 5–15)
BUN: 16 mg/dL (ref 6–20)
CO2: 25 mmol/L (ref 22–32)
Calcium: 8.8 mg/dL — ABNORMAL LOW (ref 8.9–10.3)
Chloride: 100 mmol/L (ref 98–111)
Creatinine, Ser: 1.4 mg/dL — ABNORMAL HIGH (ref 0.61–1.24)
GFR calc Af Amer: >60 mL/min (ref >60)
GFR calc non Af Amer: >60 mL/min (ref >60)
Glucose, Bld: 111 mg/dL — ABNORMAL HIGH (ref 70–99)
Potassium: 4.2 mmol/L (ref 3.5–5.1)
Sodium: 137 mmol/L (ref 135–145)

## 2019-05-19 LAB — CULTURE, BLOOD (ROUTINE X 2): Special Requests: ADEQUATE

## 2019-05-19 MED ORDER — PREGABALIN 100 MG PO CAPS: 100.0000 mg | ORAL_CAPSULE | Freq: Three times a day (TID) | ORAL | 1 refills | Status: AC

## 2019-05-19 MED ORDER — AMLODIPINE BESYLATE 10 MG PO TABS: 10.0000 mg | ORAL_TABLET | Freq: Every day | ORAL | 1 refills | Status: AC

## 2019-05-19 MED ORDER — CEFAZOLIN IV (FOR PTA / DISCHARGE USE ONLY): 2.0000 g | Freq: Three times a day (TID) | INTRAVENOUS | 0 refills | Status: AC

## 2019-05-19 MED FILL — AMLODIPINE BESYLATE 10 MG T: 10 | 30 days supply | Qty: 30 | Fill #0

## 2019-05-19 MED FILL — PREGABALIN 100 MG CAPS: 100 | 30 days supply | Qty: 90 | Fill #0

## 2019-05-19 NOTE — Progress Notes (Signed)
PHARMACY CONSULT NOTE FOR:  OUTPATIENT  PARENTERAL ANTIBIOTIC THERAPY (OPAT)  Indication: Endocarditis Regimen: Cefazolin 2 g IV q8hr  End date: 06/27/2019  IV antibiotic discharge orders are pended. To discharging provider:  please sign these orders via discharge navigator,  Select New Orders & click on the button choice - Manage This Unsigned Work.     Thank you for allowing pharmacy to be a part of this patient's care.  Ellison Carwin, PharmD PGY1 Pharmacy Resident

## 2019-05-19 NOTE — Progress Notes (Signed)
Peripherally Inserted Central Catheter/Midline Placement  The IV Nurse has discussed with the patient and/or persons authorized to consent for the patient, the purpose of this procedure and the potential benefits and risks involved with this procedure.  The benefits include less needle sticks, lab draws from the catheter, and the patient may be discharged home with the catheter. Risks include, but not limited to, infection, bleeding, blood clot (thrombus formation), and puncture of an artery; nerve damage and irregular heartbeat and possibility to perform a PICC exchange if needed/ordered by physician.  Alternatives to this procedure were also discussed.  Bard Power PICC patient education guide, fact sheet on infection prevention and patient information card has been provided to patient /or left at bedside.    PICC/Midline Placement Documentation  PICC Single Lumen 05/19/19 PICC Right Basilic 41 cm 0 cm (Active)  Indication for Insertion or Continuance of Line Home intravenous therapies (PICC only) 05/19/19 1600  Exposed Catheter (cm) 0 cm 05/19/19 1600  Site Assessment Clean;Dry;Intact 05/19/19 1600  Line Status Flushed;Saline locked;Blood return noted 05/19/19 1600  Dressing Type Transparent;Securing device 05/19/19 1600  Dressing Status Clean;Dry;Intact;Antimicrobial disc in place 05/19/19 1600  Line Care Connections checked and tightened 05/19/19 1600  Dressing Intervention New dressing 05/19/19 1600  Dressing Change Due 05/26/19 05/19/19 1600    Patient gave written consent   Tonna Boehringer 05/19/2019, 4:18 PM

## 2019-05-19 NOTE — Progress Notes (Signed)
    [facility]   Reason for visit: Follow up on endocarditis  Interval History: repeat blood cultures remain negative  Physical Exam: Constitutional:  Vitals:   05/19/19 0440 05/19/19 0440  BP:  127/84  Pulse:  89  Resp:  20  Temp: 98.9 F (37.2 C) 98.9 F (37.2 C)  SpO2:  95%   patient appears in NAD  Impression: endocarditis with Staph epi Ok for picc line today and discharge from an ID standpoint.  OPAT order in Follow up with [place] arranged in about 4 weeks Thanks for consultation

## 2019-05-19 NOTE — Discharge Summary (Signed)
Name: George Burke MRN: [id number] DOB: 10-19-1975 44 y.o. PCP: Patient, No Pcp Per  Date of Admission: 05/13/2019  9:10 AM Date of Discharge: 05/19/2019 Attending Physician: Dr. Evette Doffing  Discharge Diagnosis: 1. Staph epidermidis Bacteremia 2. Mitral Valve Endocarditis  3. Acute on Chronic Kidney Disease 4. Left Lumbar Pain 5. Constipation  6. Hypertension 7. Elevated LFTs  Discharge Medications: Allergies as of 05/19/2019   No Known Allergies     Medication List    STOP taking these medications   amitriptyline 50 MG tablet Commonly known as: ELAVIL   cloNIDine 0.2 MG tablet Commonly known as: CATAPRES   ibuprofen 200 MG tablet Commonly known as: ADVIL   metoprolol succinate 25 MG 24 hr tablet Commonly known as: TOPROL-XL     TAKE these medications   amLODipine 10 MG tablet Commonly known as: NORVASC Take 1 tablet (10 mg total) by mouth daily.   bisacodyl 10 MG/30ML Enem Commonly known as: FLEET Place 10 mg rectally once as needed (for constipation).   bisacodyl 10 MG suppository Commonly known as: DULCOLAX Place 30 mg rectally as needed for mild constipation or moderate constipation.   ceFAZolin  IVPB Commonly known as: ANCEF Inject 2 g into the vein every 8 (eight) hours. Indication:  Endocarditis Last Day of Therapy:  06/27/2019 Labs - Once weekly:  CBC/D and BMP, Labs - Every other week:  ESR and CRP   levETIRAcetam 500 MG tablet Commonly known as: Keppra Take 1 tablet (500 mg total) by mouth 2 (two) times daily.   pregabalin 100 MG capsule Commonly known as: LYRICA Take 1 capsule (100 mg total) by mouth 3 (three) times daily.   traZODone 50 MG tablet Commonly known as: DESYREL Take 50 mg by mouth at bedtime.            Home Infusion Instuctions  (From admission, onward)         Start     Ordered   05/19/19 0000  Home infusion instructions    Question:  Instructions  Answer:  Flushing of vascular access device: 0.9% NaCl pre/post  medication administration and prn patency; Heparin 100 u/ml, 110m for implanted ports and Heparin 10u/ml, 582mfor all other central venous catheters.   05/19/19 1604          Disposition and follow-up:   [facility]Rodric S [facility]as [facility]  At the hospital follow up visit please address:  1.   Ensure patient continues to be administering Ancef and follows up with ID Will need optimization of BP medication  CKD per chart review not previously worked up  Re-assess need for Lyrica, as he was started for possible S1 nerve compression seen on MRI  2.  Labs / imaging needed at time of follow-up: CBC, BMP  3.  Pending labs/ test needing follow-up: None   Follow-up Appointments: [place] [facility] [place].   Specialty: Infectious Diseases Why: Please call and make an appointment for 4 weeks out.  Contact information: [id number].   Why: The clinic will call you to schedule an appointment.  Contact information: [address] by problem list: 1. Staph epidermidis Bacteremia Patient presented to  the ED on 1/12 due to left back pain, fever, chills and had labs drawn but left before treatment could be initiated.  He was called by the hospital on 1/15 due to 2 out of 2 bottles growing coag negative staph.  He returned to the ED for treatment at which time he continued to have fevers, chills, left back pain.  Repeat cultures grew 1 out of 4 coag negative staph.  Blood cultures redrawn on 1/6 due to inadequate blood quantity on prior cultures.  1 out of 4 grew coag negative staph.  At this point, speciation showed bacteria with staph epidermidis.  Antibiotics were narrowed from  vancomycin to Ancef.  Duration was extended to 6 weeks after discovery of endocarditis.  Source of infection was never discovered as patient had no skin lesions, dental lesions, or previous orthopedic surgeries.  Panorex was negative.  Infectious disease was consulted and assisted with management.  Outpatient follow-up has been set up with [facility].   2. Mitral Valve Endocarditis  Initial TTE obtained on 1/15 that showed no evidence of vegetations but was noted for difficulty visualizing the valves.  After patient's blood cultures continued to grow staph epidermidis, ID was consulted and recommended continue with TEE.  TEE results were notable for two vegetations on the mitral valves.  Duration of antibiotic therapy was extended to 6 weeks and PICC line was placed prior to discharge.  3. Acute on Chronic Kidney Disease Secondary to hypovolemia due to poor p.o. intake prior to admission.  Resolved with IV fluids.  4. Left Lumbar Pain MRI obtained due to concern for infection after blood cultures were positive for staph epidermis.  Results were negative for spinal infection but there was notable disc protrusion at L4-5 and L5-6 with left S1 nerve root impingement.  Patient symptoms included shooting of pain into left posterior knee consistent with S1 impingement.  Initially pain was controlled with oxycodone.  Attempted to start patient on gabapentin but pain was not well controlled.  He was transitioned to Lyrica, which patient felt controlled his pain better.  Discharged on Lyrica 100 mg 3 times daily.  5. Constipation  Severe constipation noted on admission with KUB obtained on 1/18 showing moderate stool burden.  MiraLAX and senna twice daily was attempted and conservative management failed.  After this, Fleet and mineral oil enemas were attempted and unsuccessful.  Patient had a bowel movement after SMOG enema on 1/20.  6. Hypertension Patient has an intermittent history of hypertension per chart  review and previously was taking several medications, but he notes he slowly decreased them before discontinuing them, as his blood pressure measurements were within normal limits at home and at pharmacies.  During admission he was noted to be mildly hypertensive throughout and was restarted on amlodipine 10 mg.  He was discharged on this with instructions to follow-up with PCP.  7. Elevated LFTs: Patient's LFTs were elevated to a maximum of AST of 93 and ALT of 129.  Per chart review, patient had similar elevations in the past that self resolved.  Right upper quadrant ultrasound was obtained and negative.  LFTs began to improve without intervention.  Most likely secondary to acute bacteremia.  Discharge Vitals:   BP (!) 158/94 (BP Location: Right Arm)   Pulse 100   Temp 98.7 F (37.1 C) (Oral)   Resp 18   Wt 89.8 kg   SpO2 99%   BMI 27.62 kg/m   Pertinent Labs, Studies, and Procedures:  Blood cultures: 1/12:  Staph epidermis 2/2 bottles 1/15: Staph epidermis1/4 bottles, inadequate blood quantity though 1/16: Staph epidermis 1/4 bottles 1/19: BCx NGTD @ 4 days  CBC Latest Ref Rng & Units 05/19/2019 05/18/2019 05/17/2019  WBC 4.0 - 10.5 K/uL 13.6(H) 11.3(H) 10.3  Hemoglobin 13.0 - 17.0 g/dL 13.4 13.4 13.7  Hematocrit 39.0 - 52.0 % 39.9 40.0 39.5  Platelets 150 - 400 K/uL 592(H) 508(H) 418(H)   BMP Latest Ref Rng & Units 05/19/2019 05/18/2019 05/17/2019  Glucose 70 - 99 mg/dL 111(H) 105(H) 138(H)  BUN 6 - 20 mg/dL '16 13 10  '$ Creatinine 0.61 - 1.24 mg/dL 1.40(H) 1.42(H) 1.38(H)  Sodium 135 - 145 mmol/L 137 140 139  Potassium 3.5 - 5.1 mmol/L 4.2 4.1 3.5  Chloride 98 - 111 mmol/L 100 101 102  CO2 22 - 32 mmol/L '25 28 26  '$ Calcium 8.9 - 10.3 mg/dL 8.8(L) 8.7(L) 8.6(L)   Chest CXR: (05/13/2019) Negative  MRI Lumbar W/WO contrast (05/13/2019) IMPRESSION: Negative for spinal infection  Small central disc protrusion L4-5  Small left foraminal disc protrusion L5-S1 with expected  impingement left S1 nerve root.  KUB: (05/16/2019) IMPRESSION: Moderate stool burden.  No evidence of bowel obstruction or ileus.  RUQ U/S (05/17/2019) IMPRESSION: No acute abnormalities.  Normal appearing liver.  Cholecystectomy.  Panorex (05/18/2019) FINDINGS: Poor dentition is noted. No fracture or dislocation is seen involving the mandible. No lytic destruction is noted. IMPRESSION: Poor dentition.  Discharge Instructions: Discharge Instructions    Call MD for:  difficulty breathing, headache or visual disturbances   Complete by: As directed    Call MD for:  extreme fatigue   Complete by: As directed    Call MD for:  persistant dizziness or light-headedness   Complete by: As directed    Call MD for:  persistant nausea and vomiting   Complete by: As directed    Call MD for:  severe uncontrolled pain   Complete by: As directed    Call MD for:  temperature >100.4   Complete by: As directed    Diet - low sodium heart healthy   Complete by: As directed    Discharge instructions   Complete by: As directed    It was a pleasure meeting you Mr. Quentin Cornwall.   You were admitted to the hospital due to a bacterial infection in your blood stream. The bacteria is called Staph Epidermis. The infection in your blood stream has spread to one of your heart valves, so this will require a longer treatment time of at least 6 weeks of IV antibiotics. You had a PICC line placed on day of discharge. Home health and the infusion center has been set up.   We will have the internal medicine clinic here in the hospital call you to schedule an appointment. It is the same team that took care of you in the hospital. Please make sure to follow up.   In addition, please follow up with the Infectious Disease clinic in 4 weeks, as they will determine when the PICC line can be removed and when antibiotics can be stopped.   Home infusion instructions   Complete by: As directed    Instructions: Flushing of  vascular access device: 0.9% NaCl pre/post medication administration and prn patency; Heparin 100 u/ml, 38m for implanted ports and Heparin 10u/ml, 542mfor all other central venous catheters.   Increase activity slowly   Complete by: As directed       Signed: Dr. IuJose Persianternal Medicine PGY-1  Pager:  [phone] 05/21/2019, 5:12 PM

## 2019-05-19 NOTE — TOC Progression Note (Signed)
Transition of Care [facility]) - Progression Note    Patient Details  Name: STEWARD SAMES MRN: [id number] Date of Birth: 03-07-1976  Transition [facility]) CM/SW Contact  Jamere Stidham, Adria Devon, RN Phone Number: 05/19/2019, 1:45 PM  Clinical Narrative:      Vikki Ports with [facility] has accepted LOG for home health .   [name] has spoken to patient regarding payment. Patient has accepted.         Expected Discharge Plan and Services   In-house Referral: Financial Counselor Discharge Planning Services: Indigent Health Clinic, MATCH Program, Medication Assistance Post Acute Care Choice: Home Health Living arrangements for the past 2 months: Apartment                 DME Arranged: N/A                     Social Determinants of Health (SDOH) Interventions    Readmission Risk Interventions No flowsheet data found.

## 2019-05-19 NOTE — Progress Notes (Signed)
   Subjective:   Mr. Corporan reports he is doing better today. His back pain is still present but he feels Lyrica is definitely helping more than Gabapentin. He also had a BM yesterday after SMOG enema, which provided some relief.   We discussed plan to set up infusion and HH before having PICC placed. He expressed understanding and is looking forward to going home.   Objective:  Vital signs in last 24 hours: Vitals:   05/18/19 1434 05/19/19 0023 05/19/19 0440 05/19/19 0440  BP: (!) 146/97 (!) 142/85  127/84  Pulse: 98 93  89  Resp:  20  20  Temp: 98.8 F (37.1 C) 98.6 F (37 C) 98.9 F (37.2 C) 98.9 F (37.2 C)  TempSrc: Oral Oral  Oral  SpO2: 100% 95%  95%  Weight:        Physical Exam Vitals and nursing note reviewed.  Constitutional:      General: He is not in acute distress.    Appearance: He is normal weight.  Pulmonary:     Effort: Pulmonary effort is normal. No respiratory distress.  Skin:    General: Skin is warm and dry.  Neurological:     Mental Status: He is alert and oriented to person, place, and time. Mental status is at baseline.  Psychiatric:        Mood and Affect: Mood normal.        Behavior: Behavior normal.    Assessment/Plan:  Principal Problem:   Bacteremia due to coagulase-negative Staphylococcus Active Problems:   Endocarditis of mitral valve  Mr. Ariyon Mittleman is a 44 y/o male with a PMHx of HTN, epilepsy (last seizure in 03/2020), ICH withresolvedright hemiparesis (2018), polysubstance abusewho is currently admitted for staph bacteremia after presenting with fever, chills.  # Staph Epidermis Bacteremia # Mitral Valve Endocarditis  TEE on 1/19 demonstrated evidence of small mitral valve vegetation. Repeat blood cultures are NGTD at 48 hours. Discussed with ID, who feels it is safe to move forward with PICC but first infusion center and HH need to be set up.   Panorex was negative, so source is still unidentified. CBC shows worsen  leukocytosis, however afebrile, so may be fluctuation associated with severe bacteremia. Will continue to monitor while admitted.  - Appreciate ID consultation  -Ancef x 6 weeks, last day 06/24/19 -Blood cultures   - CBC -TylenolPRN for fever - PICC placement pending infusion center and HH set up  # Left Lumbar Pain MRIrevealed possible left-sided S1 nerve compression.Pain controlled with Lyrica and Oxycodone PRN.   - Lyrica 100 mg TID - Oxycodone5 mg q6h  #Constipation SMOG enema was successful yesterday and patient feels better today. Will continue with daily bowel regimen and hold off on additional enemas.   - SennaBID - MiralaxBID   # Elevated LFTs Will check CMP tomorrow if not discharge. If he does go home though, outpatient labs in the next 1 week recommended.   Dispo: Anticipated discharge pending successful PICC placement.   Dr. Verdene Lennert Internal Medicine PGY-1  Pager: [phone] 05/19/2019, 9:30 AM

## 2019-05-19 NOTE — Plan of Care (Signed)
Pt for discharge today after PICC placement, alert and oriented, ambulatory, tolerates her meal, family at the bedside, given pain meds, home heath came and did bedside teaching with the family, I spoke with Heal[facility] case manager, they will bring the antibiotic tonight at home and family will start the antibiotic and home health will come tomorrow at 3 pm, given all his personal belongings, health teachings, next appointment and due med explained and understood.

## 2019-05-22 LAB — CULTURE, BLOOD (ROUTINE X 2)
Culture: NO GROWTH
Culture: NO GROWTH
Special Requests: ADEQUATE
Special Requests: ADEQUATE

## 2019-05-24 ENCOUNTER — Telehealth: Payer: Self-pay

## 2019-05-24 ENCOUNTER — Telehealth: Payer: Self-pay | Admitting: Internal Medicine

## 2019-05-24 NOTE — Telephone Encounter (Signed)
Received call from [facility] nurse requesting verbal order for nursing service for picc care/dressing changes. Verbal order provided. HH team will fax necessary written order for signature.  Valarie Cones

## 2019-05-24 NOTE — Telephone Encounter (Signed)
[facility] VO for PICC Dressing and Changes and Weekly Lab work.  Please Call back.

## 2019-05-24 NOTE — Telephone Encounter (Signed)
VO was for [facility] to obtain orders from [facility]. Do you agree dr Huel Cote?

## 2019-05-27 NOTE — Telephone Encounter (Signed)
Yes

## 2019-06-04 ENCOUNTER — Other Ambulatory Visit
Admission: RE | Admit: 2019-06-04 | Discharge: 2019-06-04 | Disposition: A | Discharge status: Home / Self Care | Payer: Self-pay | Source: Ambulatory Visit | Attending: Family | Admitting: Family

## 2019-06-04 DIAGNOSIS — I330 Acute and subacute infective endocarditis: Secondary | ICD-10-CM | POA: Insufficient documentation

## 2019-06-04 DIAGNOSIS — Z792 Long term (current) use of antibiotics: Secondary | ICD-10-CM | POA: Insufficient documentation

## 2019-06-04 DIAGNOSIS — R7881 Bacteremia: Secondary | ICD-10-CM | POA: Insufficient documentation

## 2019-06-04 LAB — CBC WITH DIFFERENTIAL/PLATELET
Abs Immature Granulocytes: 0.01 10*3/uL (ref 0.00–0.07)
Basophils Absolute: 0.1 10*3/uL (ref 0.0–0.1)
Basophils Relative: 2 %
Eosinophils Absolute: 0.4 10*3/uL (ref 0.0–0.5)
Eosinophils Relative: 8 %
HCT: 38.7 % — ABNORMAL LOW (ref 39.0–52.0)
Hemoglobin: 12.7 g/dL — ABNORMAL LOW (ref 13.0–17.0)
Immature Granulocytes: 0 %
Lymphocytes Relative: 44 %
Lymphs Abs: 2.2 10*3/uL (ref 0.7–4.0)
MCH: 29.9 pg (ref 26.0–34.0)
MCHC: 32.8 g/dL (ref 30.0–36.0)
MCV: 91.1 fL (ref 80.0–100.0)
Monocytes Absolute: 0.5 10*3/uL (ref 0.1–1.0)
Monocytes Relative: 9 %
Neutro Abs: 1.8 10*3/uL (ref 1.7–7.7)
Neutrophils Relative %: 37 %
Platelets: 417 10*3/uL — ABNORMAL HIGH (ref 150–400)
RBC: 4.25 MIL/uL (ref 4.22–5.81)
RDW: 12.3 % (ref 11.5–15.5)
WBC: 5 10*3/uL (ref 4.0–10.5)
nRBC: 0 % (ref 0.0–0.2)

## 2019-06-04 LAB — COMPREHENSIVE METABOLIC PANEL
ALT: 7 U/L (ref 0–44)
AST: 18 U/L (ref 15–41)
Albumin: 3.7 g/dL (ref 3.5–5.0)
Alkaline Phosphatase: 56 U/L (ref 38–126)
Anion gap: 13 (ref 5–15)
BUN: 12 mg/dL (ref 6–20)
CO2: 24 mmol/L (ref 22–32)
Calcium: 8.9 mg/dL (ref 8.9–10.3)
Chloride: 104 mmol/L (ref 98–111)
Creatinine, Ser: 1.06 mg/dL (ref 0.61–1.24)
GFR calc Af Amer: >60 mL/min (ref >60)
GFR calc non Af Amer: >60 mL/min (ref >60)
Glucose, Bld: 105 mg/dL — ABNORMAL HIGH (ref 70–99)
Potassium: 3.6 mmol/L (ref 3.5–5.1)
Sodium: 141 mmol/L (ref 135–145)
Total Bilirubin: 0.4 mg/dL (ref 0.3–1.2)
Total Protein: 6.7 g/dL (ref 6.5–8.1)

## 2019-06-18 ENCOUNTER — Other Ambulatory Visit
Admission: RE | Admit: 2019-06-18 | Discharge: 2019-06-18 | Disposition: A | Discharge status: Home / Self Care | Payer: Self-pay | Source: Ambulatory Visit | Attending: Internal Medicine | Admitting: Internal Medicine

## 2019-06-18 LAB — CBC WITH DIFFERENTIAL/PLATELET
Abs Immature Granulocytes: 0.01 10*3/uL (ref 0.00–0.07)
Basophils Absolute: 0.1 10*3/uL (ref 0.0–0.1)
Basophils Relative: 1 %
Eosinophils Absolute: 0.5 10*3/uL (ref 0.0–0.5)
Eosinophils Relative: 8 %
HCT: 39.9 % (ref 39.0–52.0)
Hemoglobin: 13.5 g/dL (ref 13.0–17.0)
Immature Granulocytes: 0 %
Lymphocytes Relative: 33 %
Lymphs Abs: 2.1 10*3/uL (ref 0.7–4.0)
MCH: 30.8 pg (ref 26.0–34.0)
MCHC: 33.8 g/dL (ref 30.0–36.0)
MCV: 90.9 fL (ref 80.0–100.0)
Monocytes Absolute: 0.7 10*3/uL (ref 0.1–1.0)
Monocytes Relative: 11 %
Neutro Abs: 2.9 10*3/uL (ref 1.7–7.7)
Neutrophils Relative %: 47 %
Platelets: 302 10*3/uL (ref 150–400)
RBC: 4.39 MIL/uL (ref 4.22–5.81)
RDW: 12.7 % (ref 11.5–15.5)
WBC: 6.3 10*3/uL (ref 4.0–10.5)
nRBC: 0 % (ref 0.0–0.2)

## 2019-06-18 LAB — COMPREHENSIVE METABOLIC PANEL
ALT: 8 U/L (ref 0–44)
AST: 17 U/L (ref 15–41)
Albumin: 3.7 g/dL (ref 3.5–5.0)
Alkaline Phosphatase: 56 U/L (ref 38–126)
Anion gap: 10 (ref 5–15)
BUN: 17 mg/dL (ref 6–20)
CO2: 24 mmol/L (ref 22–32)
Calcium: 8.9 mg/dL (ref 8.9–10.3)
Chloride: 105 mmol/L (ref 98–111)
Creatinine, Ser: 1.09 mg/dL (ref 0.61–1.24)
GFR calc Af Amer: >60 mL/min (ref >60)
GFR calc non Af Amer: >60 mL/min (ref >60)
Glucose, Bld: 90 mg/dL (ref 70–99)
Potassium: 4.4 mmol/L (ref 3.5–5.1)
Sodium: 139 mmol/L (ref 135–145)
Total Bilirubin: 0.6 mg/dL (ref 0.3–1.2)
Total Protein: 6.3 g/dL — ABNORMAL LOW (ref 6.5–8.1)

## 2019-06-18 LAB — SEDIMENTATION RATE: Sed Rate: 12 mm/hr (ref 0–15)

## 2019-06-22 ENCOUNTER — Inpatient Hospital Stay: Payer: Self-pay | Admitting: Family

## 2019-06-23 ENCOUNTER — Telehealth: Payer: Self-pay

## 2019-06-23 NOTE — Telephone Encounter (Signed)
Was able to reach patient and rescheduled appointment with George Burke early next week.  Valarie Cones

## 2019-06-23 NOTE — Telephone Encounter (Signed)
Called patient to rescheduled missed appointment. Left voicemail to call [facility] to reschedule. Patient's end date of IV therapy 06/28/19. Will try to call again.  Valarie Cones

## 2019-06-25 ENCOUNTER — Other Ambulatory Visit
Admission: RE | Admit: 2019-06-25 | Discharge: 2019-06-25 | Disposition: A | Discharge status: Home / Self Care | Payer: Self-pay | Source: Ambulatory Visit | Attending: Internal Medicine | Admitting: Internal Medicine

## 2019-06-25 LAB — COMPREHENSIVE METABOLIC PANEL
ALT: 9 U/L (ref 0–44)
AST: 18 U/L (ref 15–41)
Albumin: 4.3 g/dL (ref 3.5–5.0)
Alkaline Phosphatase: 62 U/L (ref 38–126)
Anion gap: 13 (ref 5–15)
BUN: 11 mg/dL (ref 6–20)
CO2: 20 mmol/L — ABNORMAL LOW (ref 22–32)
Calcium: 9.2 mg/dL (ref 8.9–10.3)
Chloride: 107 mmol/L (ref 98–111)
Creatinine, Ser: 0.91 mg/dL (ref 0.61–1.24)
GFR calc Af Amer: >60 mL/min (ref >60)
GFR calc non Af Amer: >60 mL/min (ref >60)
Glucose, Bld: 73 mg/dL (ref 70–99)
Potassium: 3.9 mmol/L (ref 3.5–5.1)
Sodium: 140 mmol/L (ref 135–145)
Total Bilirubin: 0.8 mg/dL (ref 0.3–1.2)
Total Protein: 7.2 g/dL (ref 6.5–8.1)

## 2019-06-25 LAB — CBC WITH DIFFERENTIAL/PLATELET
Abs Immature Granulocytes: 0.01 10*3/uL (ref 0.00–0.07)
Basophils Absolute: 0.1 10*3/uL (ref 0.0–0.1)
Basophils Relative: 1 %
Eosinophils Absolute: 0.1 10*3/uL (ref 0.0–0.5)
Eosinophils Relative: 2 %
HCT: 43.9 % (ref 39.0–52.0)
Hemoglobin: 14.9 g/dL (ref 13.0–17.0)
Immature Granulocytes: 0 %
Lymphocytes Relative: 23 %
Lymphs Abs: 1.3 10*3/uL (ref 0.7–4.0)
MCH: 30.7 pg (ref 26.0–34.0)
MCHC: 33.9 g/dL (ref 30.0–36.0)
MCV: 90.3 fL (ref 80.0–100.0)
Monocytes Absolute: 0.5 10*3/uL (ref 0.1–1.0)
Monocytes Relative: 9 %
Neutro Abs: 3.8 10*3/uL (ref 1.7–7.7)
Neutrophils Relative %: 65 %
Platelets: 324 10*3/uL (ref 150–400)
RBC: 4.86 MIL/uL (ref 4.22–5.81)
RDW: 12.9 % (ref 11.5–15.5)
WBC: 5.8 10*3/uL (ref 4.0–10.5)
nRBC: 0 % (ref 0.0–0.2)

## 2019-06-27 ENCOUNTER — Inpatient Hospital Stay: Payer: Self-pay | Admitting: Family

## 2019-06-27 ENCOUNTER — Telehealth: Payer: Self-pay | Admitting: Internal Medicine

## 2019-06-27 NOTE — Telephone Encounter (Signed)
[facility] RN .TOC APPT [facility] FOR 07/04/2019 with [facility].  Please call back and speak with Katie.  Patient has provided a new number of [phone] that is now updated. Pt was unable to be reached for a HFU before due to wrong number listed in the chart.

## 2019-06-28 NOTE — Telephone Encounter (Signed)
Attempted to reach Florentina Addison, RN with [facility]. No answer. Left message on VM requesting return call.   Call placed to patient for [facility] call. No answer and VMB is full and cannot accept messages at this time. Kinnie Feil, BSN, RN-BC

## 2019-06-30 NOTE — Telephone Encounter (Signed)
Attempted to reach patient for TOC call at all 3 numbers listed. No answer. Left message on VM requesting return call on home number. Work number is not in service and mobile VMB is full. Kinnie Feil, RN, BSN

## 2019-07-04 ENCOUNTER — Encounter: Payer: Self-pay | Admitting: Internal Medicine

## 2019-07-04 ENCOUNTER — Other Ambulatory Visit: Payer: Self-pay

## 2019-07-04 ENCOUNTER — Ambulatory Visit: Payer: Self-pay | Admitting: Internal Medicine

## 2019-07-04 VITALS — BP 127/91 | HR 88 | Temp 98.2°F | Ht 70.0 in | Wt 195.0 lb

## 2019-07-04 DIAGNOSIS — Z8679 Personal history of other diseases of the circulatory system: Secondary | ICD-10-CM

## 2019-07-04 DIAGNOSIS — Z79899 Other long term (current) drug therapy: Secondary | ICD-10-CM

## 2019-07-04 DIAGNOSIS — Z8619 Personal history of other infectious and parasitic diseases: Secondary | ICD-10-CM

## 2019-07-04 DIAGNOSIS — G4700 Insomnia, unspecified: Secondary | ICD-10-CM

## 2019-07-04 DIAGNOSIS — G40909 Epilepsy, unspecified, not intractable, without status epilepticus: Secondary | ICD-10-CM

## 2019-07-04 DIAGNOSIS — Z87820 Personal history of traumatic brain injury: Secondary | ICD-10-CM

## 2019-07-04 MED ORDER — LEVETIRACETAM 500 MG PO TABS: 500.0000 mg | ORAL_TABLET | Freq: Two times a day (BID) | ORAL | 0 refills | Status: DC

## 2019-07-04 MED ORDER — TRAZODONE HCL 50 MG PO TABS: 50.0000 mg | ORAL_TABLET | Freq: Every day | ORAL | 0 refills | Status: AC

## 2019-07-04 NOTE — Progress Notes (Signed)
   CC: Hospital discharge follow-up for endocarditis  HPI:George Burke is a 44 y.o. male who presents today to establish care. Please see individual problem based A/P for details.  Past Medical History:  Diagnosis Date  . Hemorrhagic stroke (HCC)   . Hypertension    Review of Systems:   Review of Systems  Constitutional: Negative for chills, diaphoresis and fever.  HENT: Negative for ear pain and sinus pain.   Eyes: Negative for blurred vision, photophobia and redness.  Respiratory: Negative for cough and shortness of breath.   Cardiovascular: Negative for chest pain and leg swelling.  Gastrointestinal: Negative for constipation, diarrhea, nausea and vomiting.  Genitourinary: Negative for flank pain and urgency.  Musculoskeletal: Negative for myalgias.  Neurological: Negative for dizziness, focal weakness and headaches.  Psychiatric/Behavioral: Negative for depression and substance abuse. The patient has insomnia. The patient is not nervous/anxious.    Family History: HTN father  Social History: Lives with girlfriend Drinks alcohol socially Does not use tobacco Enjoys his work  PMHx: Past Medical History:  Diagnosis Date  . Hemorrhagic stroke (HCC)   . Hypertension    Surgical Hx: Past Surgical History:  Procedure Laterality Date  . BUBBLE STUDY  05/17/2019   Procedure: BUBBLE STUDY;  Surgeon: Parke Poisson, MD;  Location: [facility] ENDOSCOPY;  Service: Cardiology;;  . TEE WITHOUT CARDIOVERSION N/A 05/17/2019   Procedure: TRANSESOPHAGEAL ECHOCARDIOGRAM (TEE);  Surgeon: Parke Poisson, MD;  Location: [facility] ENDOSCOPY;  Service: Cardiology;  Laterality: N/A;   Allergies: NKDA  Physical Exam: Vitals:   07/04/19 0855  BP: (!) 127/91  Pulse: 88  Temp: 98.2 F (36.8 C)  TempSrc: Oral  SpO2: 100%  Weight: 195 lb (88.5 kg)  Height: 5\' 10"  (1.778 m)   General: A/O x4, in no acute distress, afebrile, nondiaphoretic HEENT: PEERL, EMO intact Cardio: RRR, no mrg's   Pulmonary: CTA bilaterally, no wheezing or crackles  Abdomen: Bowel sounds normal, soft, nontender  MSK: BLE nontender, nonedematous Neuro: Alert, CNII-XII grossly intact, conversational, strength 5/5 in the upper and lower extremities bilaterally, normal gait Psych: Appropriate affect, not depressed in appearance, engages well  Assessment & Plan:   See Encounters Tab for problem based charting.  Patient discussed with Dr. 

## 2019-07-04 NOTE — Assessment & Plan Note (Signed)
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 Patient was to follow-up with [facility] but due to a change in his phone number he did not receive the voicemail to do so.  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  He denies symptoms consistent with endocarditis and feels much improved from the standpoint  Plan: I will reach out to infectious disease to determine if a TEE versus TTE is indicated as well as if a follow-up appointment is needed in with the [facility] ESR, CRP, CBC today TTE vs TEE

## 2019-07-04 NOTE — Patient Instructions (Signed)
FOLLOW-UP INSTRUCTIONS When: 4-6 wks For: F/up of your sleep disorder What to bring: All of your medications  Today we discussed insomnia, seizure disorder, history of bacterial endocarditis.  For the insomnia I recommend continuing melatonin 10 mg nightly and taking this within 10 to 15 minutes at the same time each night. Additionally, I recommend that you increase your sleep length to at least 6 hours nightly, reduce the time spent using your cell phone or watching TV prior to bed, and reduce the total caffeine intake to less than 300 mg daily with no caffeine 6 hours prior to bed.  After 2-week period, I recommend filling the prescription for trazodone to assist with reestablishing a proper sleep-wake cycle.   For your seizure disorder I recommend that you continue to follow with neurology.  You are currently scheduled to see Dr. Logan Bores on August 03, 2019.  Please call the number below to confirm your appointment time.  If you would like to switch to a local neurologist please call and schedule.  If you need a referral please let us know.  Lavone Neri, MD  Neurology  [address]  [place] [address]    Phone: [phone]  Fax: [phone]  For the bacterial endocarditis I recommend basic lab work today and following up with the [facility].  I will reach out to them to see if they want to schedule a heart scan: Echocardiogram.  I will notify you of the results of any labs from today's evaluation when available to me.   Thank you for your visit to the [facility] today. If you have any questions or concerns please call us at [phone].

## 2019-07-04 NOTE — Assessment & Plan Note (Addendum)
  History intracranial hemorrhage: Occurred July 07, 2016.  There was concern for an AVM versus anabolic steroid use as to the etiology. He has followed with Neurology and quit using anabolic steroids when this occurred. No known sequela.  Patient requests to know a more specific etiology.    Plan: I have advised the patient to discuss the etiology with his neurologist

## 2019-07-04 NOTE — Assessment & Plan Note (Signed)
History of Seizures: Felt to be secondary to intracranial hemorrhage as evidenced on MRI and MRA in March 2018.  Last known episode was March 2020.  Patient was to continue on Keppra 500 mg twice daily per the most recent neurology note but has now run out of medication.  I filled a 30-day supply today and have advised him that he has a follow-up appointment already scheduled with [facility] neurology on April 7.  He is requesting to schedule with a neurologist in town for convenience to improve compliance.  I have advised him to discuss that with his neurologist and to call and schedule at his convenience .   Plan: Refill Keppra 500 mg twice daily today Follow-up with neurology

## 2019-07-04 NOTE — Assessment & Plan Note (Signed)
Patient endorses insomnia since hospital discharge in January.  He states that he is able to sleep for approximate 30 minutes to an hour before awakening from sleep.  On average he lays down to sleep around 10-11 PM and arises at about 4 AM to go to work.  In the past he has ingested high doses of caffeine with preworkout supplements, worked long hours daily, exercised, showered and went to bed without significant interim.  Sleep hygiene described as moderate to poor based on his activity level, TV use at night, and overall short amount of time sleeping.  He denies significant EtOH use or other stimulant use.  He is self treated with melatonin 10 mg semiconsistently the past 3 weeks.  Plan: We discussed sleep hygiene modifications and increasing amounts time sleeping nightly Advised the patient to cap his caffeine use 300 mg daily Reinforced use of melatonin 10 mg nightly with an approximately 15-minute window for 2 weeks minimum Recommended trazodone 50 mg nightly for 2 weeks in addition to melatonin after a 2-week trial of melatonin only Advised the patient follow-up with his neurologist

## 2019-07-05 LAB — CBC WITH DIFFERENTIAL/PLATELET
Basophils Absolute: 0.1 10*3/uL (ref 0.0–0.2)
Basos: 1 %
EOS (ABSOLUTE): 0.3 10*3/uL (ref 0.0–0.4)
Eos: 5 %
Hematocrit: 46.3 % (ref 37.5–51.0)
Hemoglobin: 15.7 g/dL (ref 13.0–17.7)
Immature Grans (Abs): 0 10*3/uL (ref 0.0–0.1)
Immature Granulocytes: 0 %
Lymphocytes Absolute: 1.5 10*3/uL (ref 0.7–3.1)
Lymphs: 27 %
MCH: 30.9 pg (ref 26.6–33.0)
MCHC: 33.9 g/dL (ref 31.5–35.7)
MCV: 91 fL (ref 79–97)
Monocytes Absolute: 0.6 10*3/uL (ref 0.1–0.9)
Monocytes: 11 %
Neutrophils Absolute: 3.2 10*3/uL (ref 1.4–7.0)
Neutrophils: 56 %
Platelets: 340 10*3/uL (ref 150–450)
RBC: 5.08 x10E6/uL (ref 4.14–5.80)
RDW: 12.9 % (ref 11.6–15.4)
WBC: 5.6 10*3/uL (ref 3.4–10.8)

## 2019-07-05 LAB — C-REACTIVE PROTEIN: CRP: <1 mg/L (ref 0–10)

## 2019-07-05 LAB — SEDIMENTATION RATE: Sed Rate: 4 mm/hr (ref 0–15)

## 2019-07-06 NOTE — Progress Notes (Signed)
Internal Medicine Clinic Attending  Case discussed with Dr. Harbrecht at the time of the visit.  We reviewed the resident's history and exam and pertinent patient test results.  I agree with the assessment, diagnosis, and plan of care documented in the resident's note.   

## 2019-07-26 ENCOUNTER — Other Ambulatory Visit: Payer: Self-pay | Admitting: Internal Medicine

## 2019-07-26 DIAGNOSIS — G40909 Epilepsy, unspecified, not intractable, without status epilepticus: Secondary | ICD-10-CM

## 2020-05-31 ENCOUNTER — Ambulatory Visit (HOSPITAL_COMMUNITY)
Admission: EM | Admit: 2020-05-31 | Discharge: 2020-05-31 | Disposition: A | Discharge status: Home / Self Care | Payer: BC Managed Care – PPO

## 2020-05-31 ENCOUNTER — Encounter (HOSPITAL_COMMUNITY): Payer: Self-pay

## 2020-05-31 ENCOUNTER — Ambulatory Visit (INDEPENDENT_AMBULATORY_CARE_PROVIDER_SITE_OTHER): Payer: BC Managed Care – PPO

## 2020-05-31 ENCOUNTER — Other Ambulatory Visit: Payer: Self-pay

## 2020-05-31 DIAGNOSIS — M545 Low back pain, unspecified: Secondary | ICD-10-CM

## 2020-05-31 DIAGNOSIS — W19XXXA Unspecified fall, initial encounter: Secondary | ICD-10-CM

## 2020-05-31 DIAGNOSIS — R0789 Other chest pain: Secondary | ICD-10-CM

## 2020-05-31 MED ORDER — NAPROXEN 500 MG PO TABS: 500.0000 mg | ORAL_TABLET | Freq: Two times a day (BID) | ORAL | 0 refills | Status: AC

## 2020-05-31 MED ORDER — CYCLOBENZAPRINE HCL 10 MG PO TABS: 10.0000 mg | ORAL_TABLET | Freq: Every day | ORAL | 0 refills | Status: AC

## 2020-05-31 MED ORDER — PREDNISONE 10 MG PO TABS: 40.0000 mg | ORAL_TABLET | Freq: Every day | ORAL | 0 refills | Status: DC

## 2020-05-31 NOTE — ED Provider Notes (Signed)
[facility]    CSN: [id number] Arrival date & time: 05/31/20  1506      History   Chief Complaint Chief Complaint  Patient presents with  . Back Pain    HPI JOHNRYAN SAO is a 45 y.o. male.   Patient presents with left sided back pain, neck pain, left buttocks and left rib pain at 10/10 starting 2/2 after a fall. Landed on his buttocks with feet in the air. associated spasms in left leg. Pulling sensation and pain elicited with flexion of neck. Pulling sensation elicited with flexion of leg. Denies numbness, tingling, urinary retention and incontinence. Attempted ibuprofen with no relief.   Past Medical History:  Diagnosis Date  . Bacteremia due to coagulase-negative Staphylococcus 05/13/2019  . Hemorrhagic stroke (HCC)   . Hypertension     Patient Active Problem List   Diagnosis Date Noted  . History of intracranial hemorrhage 07/04/2019  . Seizure disorder (HCC) 07/04/2019  . Insomnia 07/04/2019  . 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 BUBBLE STUDY  05/17/2019   Procedure: BUBBLE STUDY;  Surgeon: Parke Poisson, MD;  Location: [facility] ENDOSCOPY;  Service: Cardiology;;  . TEE WITHOUT CARDIOVERSION N/A 05/17/2019   Procedure: TRANSESOPHAGEAL ECHOCARDIOGRAM (TEE);  Surgeon: Parke Poisson, MD;  Location: [facility] ENDOSCOPY;  Service: Cardiology;  Laterality: N/A;       Home Medications    Prior to Admission medications   Medication Sig Start Date End Date Taking? Authorizing Provider  amLODipine (NORVASC) 10 MG tablet Take 1 tablet (10 mg total) by mouth daily. 05/19/19   Verdene Lennert, MD  bisacodyl (DULCOLAX) 10 MG suppository Place 30 mg rectally as needed for mild constipation or moderate constipation.    [provider]  bisacodyl (FLEET) 10 MG/30ML ENEM Place 10 mg rectally once as needed (for constipation).    [provider]  levETIRAcetam (KEPPRA) 500 MG tablet TAKE 1  TABLET BY MOUTH TWICE A DAY 07/27/19   Dellia Cloud, MD  pregabalin (LYRICA) 100 MG capsule Take 1 capsule (100 mg total) by mouth 3 (three) times daily. 05/19/19   Verdene Lennert, MD  traZODone (DESYREL) 50 MG tablet Take 1 tablet (50 mg total) by mouth at bedtime. 07/04/19   Lanelle Bal, MD    Family History History reviewed. No pertinent family history.  Social History Social History   Tobacco Use  . Smoking status: Current Every Day Smoker    Types: Cigarettes  . Smokeless tobacco: Never Used  . Tobacco comment: .5 PPD  Substance Use Topics  . Alcohol use: Yes  . Drug use: Yes    Types: Cocaine    Comment: rare use about a month ago      Allergies   Patient has no known allergies.   Review of Systems Review of Systems  Constitutional: Negative.   HENT: Negative.   Respiratory: Negative.   Genitourinary: Negative.   Musculoskeletal: Positive for back pain and neck pain. Negative for arthralgias, gait problem, joint swelling, myalgias and neck stiffness.  Skin: Negative.   Neurological: Negative.   Psychiatric/Behavioral: Negative.      Physical Exam Triage Vital Signs ED Triage Vitals  Enc Vitals Group     BP 05/31/20 1605 (!) 145/97     Pulse Rate 05/31/20 1605 88     Resp 05/31/20 1605 19     Temp 05/31/20 1605 98.6 F (37 C)     Temp src --  SpO2 05/31/20 1605 98 %     Weight --      Height --      Head Circumference --      Peak Flow --      Pain Score 05/31/20 1603 10     Pain Loc --      Pain Edu? --      Excl. in GC? --    No data found.  Updated Vital Signs BP (!) 145/97   Pulse 88   Temp 98.6 F (37 C)   Resp 19   SpO2 98%   Visual Acuity Right Eye Distance:   Left Eye Distance:   Bilateral Distance:    Right Eye Near:   Left Eye Near:    Bilateral Near:     Physical Exam Constitutional:      Appearance: Normal appearance. He is normal weight.  HENT:     Head: Normocephalic.  Eyes:     Extraocular Movements:  Extraocular movements intact.  Neck:     Comments: Pain elicited with flexion  Pulmonary:     Effort: Pulmonary effort is normal.  Musculoskeletal:        General: No swelling, deformity or signs of injury.     Cervical back: Normal range of motion.     Left lower leg: No edema.     Comments: Tenderness over thoracic and lumbar vertebra, tenderness over left latissimus dorsi, tenderness of left rib cage, tenderness over gluteus medius, flexion ROM limited to 45 degrees on left leg   Skin:    General: Skin is warm and dry.     Capillary Refill: Capillary refill takes less than 2 seconds.  Neurological:     General: No focal deficit present.     Mental Status: He is alert and oriented to person, place, and time. Mental status is at baseline.  Psychiatric:        Mood and Affect: Mood normal.        Behavior: Behavior normal.        Thought Content: Thought content normal.        Judgment: Judgment normal.      UC Treatments / Results  Labs (all labs ordered are listed, but only abnormal results are displayed) Labs Reviewed - No data to display  EKG   Radiology No results found.  Procedures Procedures (including critical care time)  Medications Ordered in UC Medications - No data to display  Initial Impression / Assessment and Plan / UC Course  I have reviewed the triage vital signs and the nursing notes.  Pertinent labs & imaging results that were available during my care of the patient were reviewed by me and considered in my medical decision making (see chart for details).  Acute Lower back without sciatica  1. Lumbar x-ray and left rib xray completed today.  2. Prescribed Flexeril 10 mg at bedtime 3.Prescribed prednisone 40 mg daily for 5 days 4.Prescirbed naproxen 500 mg bid  5. Can use heating pad for 15 minute intervals for additional releif Final Clinical Impressions(s) / UC Diagnoses   Final diagnoses:  None   Discharge Instructions   None    ED  Prescriptions    None     PDMP not reviewed this encounter.   Valinda Hoar, NP 05/31/20 [phone]

## 2020-05-31 NOTE — Discharge Instructions (Signed)
Follow up if symptoms worsen  Take muscle relaxer at bedtime  Taken Naproxen twice a day, once in the morning, once in the evening with food Take 4 pills of steroid medication each morning for 5 days.  4. Can use heating pad in 15 minute intervals for additional comfort

## 2020-05-31 NOTE — ED Triage Notes (Signed)
Pt in with c/o left side back pain that radiates to his neck and buttock that occurred yesterday after he fell and was pulling himself up  Pt has been taking ibuprofen for pain with no relief  States it feels like he's having spasms

## 2020-06-21 ENCOUNTER — Ambulatory Visit (INDEPENDENT_AMBULATORY_CARE_PROVIDER_SITE_OTHER): Payer: BC Managed Care – PPO

## 2020-06-21 ENCOUNTER — Ambulatory Visit
Admission: RE | Admit: 2020-06-21 | Discharge: 2020-06-21 | Disposition: A | Discharge status: Home / Self Care | Payer: BC Managed Care – PPO | Source: Ambulatory Visit | Attending: Internal Medicine | Admitting: Internal Medicine

## 2020-06-21 ENCOUNTER — Other Ambulatory Visit: Payer: Self-pay

## 2020-06-21 ENCOUNTER — Encounter (HOSPITAL_COMMUNITY): Payer: Self-pay

## 2020-06-21 ENCOUNTER — Ambulatory Visit (HOSPITAL_COMMUNITY)
Admission: EM | Admit: 2020-06-21 | Discharge: 2020-06-21 | Disposition: A | Discharge status: Home / Self Care | Payer: BC Managed Care – PPO | Attending: Medical Oncology | Admitting: Medical Oncology

## 2020-06-21 DIAGNOSIS — M545 Low back pain, unspecified: Secondary | ICD-10-CM

## 2020-06-21 DIAGNOSIS — W19XXXA Unspecified fall, initial encounter: Secondary | ICD-10-CM | POA: Diagnosis not present

## 2020-06-21 DIAGNOSIS — M5442 Lumbago with sciatica, left side: Secondary | ICD-10-CM | POA: Diagnosis not present

## 2020-06-21 DIAGNOSIS — S39012A Strain of muscle, fascia and tendon of lower back, initial encounter: Secondary | ICD-10-CM

## 2020-06-21 MED ORDER — PREDNISONE 50 MG PO TABS: 50.0000 mg | ORAL_TABLET | Freq: Every day | ORAL | 0 refills | Status: AC

## 2020-06-21 MED ORDER — TIZANIDINE HCL 4 MG PO TABS
4.0000 mg | ORAL_TABLET | Freq: Every day | ORAL | 0 refills | Status: AC
Start: 2020-06-21 — End: ?

## 2020-06-21 NOTE — ED Provider Notes (Signed)
[facility]   MRN: [id number] DOB: 12/12/1975  Subjective:   George Burke is a 45 y.o. male presenting for 1 week history of recurrent severe low back pain that radiates to the left side into his buttock and posterior thigh on the left side into his calf and foot.  Symptoms are worse when he stands upright, sits for long periods of time more is in a car in has a bumpy ride.  Of note, patient did suffer an injury about 3 weeks ago, refer to the note from that office visit for more detail.  He had negative imaging then.  He was given a prednisone and had good relief but unfortunately injured himself at work while he was bending and twisting involving the low back, pulling something that was very heavy off of a truck.  No current facility-administered medications for this encounter.  Current Outpatient Medications:  .  amLODipine (NORVASC) 10 MG tablet, Take 1 tablet (10 mg total) by mouth daily., Disp: 30 tablet, Rfl: 1 .  bisacodyl (DULCOLAX) 10 MG suppository, Place 30 mg rectally as needed for mild constipation or moderate constipation., Disp: , Rfl:  .  bisacodyl (FLEET) 10 MG/30ML ENEM, Place 10 mg rectally once as needed (for constipation)., Disp: , Rfl:  .  cyclobenzaprine (FLEXERIL) 10 MG tablet, Take 1 tablet (10 mg total) by mouth at bedtime., Disp: 10 tablet, Rfl: 0 .  levETIRAcetam (KEPPRA) 500 MG tablet, TAKE 1 TABLET BY MOUTH TWICE A DAY, Disp: 180 tablet, Rfl: 0 .  naproxen (NAPROSYN) 500 MG tablet, Take 1 tablet (500 mg total) by mouth 2 (two) times daily., Disp: 30 tablet, Rfl: 0 .  predniSONE (DELTASONE) 10 MG tablet, Take 4 tablets (40 mg total) by mouth daily., Disp: 20 tablet, Rfl: 0 .  pregabalin (LYRICA) 100 MG capsule, Take 1 capsule (100 mg total) by mouth 3 (three) times daily., Disp: 90 capsule, Rfl: 1 .  traZODone (DESYREL) 50 MG tablet, Take 1 tablet (50 mg total) by mouth at bedtime., Disp: 30 tablet, Rfl: 0   No Known Allergies  Past Medical  History:  Diagnosis Date  . Bacteremia due to coagulase-negative Staphylococcus 05/13/2019  . Hemorrhagic stroke (HCC)   . Hypertension      Past Surgical History:  Procedure Laterality Date  . BUBBLE STUDY  05/17/2019   Procedure: BUBBLE STUDY;  Surgeon: Parke Poisson, MD;  Location: [facility] ENDOSCOPY;  Service: Cardiology;;  . TEE WITHOUT CARDIOVERSION N/A 05/17/2019   Procedure: TRANSESOPHAGEAL ECHOCARDIOGRAM (TEE);  Surgeon: Parke Poisson, MD;  Location: [facility] ENDOSCOPY;  Service: Cardiology;  Laterality: N/A;    History reviewed. No pertinent family history.  Social History   Tobacco Use  . Smoking status: Current Every Day Smoker    Types: Cigarettes  . Smokeless tobacco: Never Used  . Tobacco comment: .5 PPD  Substance Use Topics  . Alcohol use: Yes  . Drug use: Yes    Types: Cocaine    Comment: rare use about a month ago     ROS   Objective:   Vitals: BP 116/62 (BP Location: Left Arm)   Pulse 97   Temp 98.1 F (36.7 C) (Oral)   Resp 18   SpO2 99%   Physical Exam Constitutional:      General: He is not in acute distress.    Appearance: Normal appearance. He is well-developed and normal weight. He is not ill-appearing, toxic-appearing or diaphoretic.  HENT:     Head:  Normocephalic and atraumatic.     Right Ear: External ear normal.     Left Ear: External ear normal.     Nose: Nose normal.     Mouth/Throat:     Pharynx: Oropharynx is clear.  Eyes:     General: No scleral icterus.       Right eye: No discharge.        Left eye: No discharge.     Extraocular Movements: Extraocular movements intact.     Pupils: Pupils are equal, round, and reactive to light.  Cardiovascular:     Rate and Rhythm: Normal rate.  Pulmonary:     Effort: Pulmonary effort is normal.  Musculoskeletal:     Cervical back: Normal range of motion.     Lumbar back: Spasms and tenderness present. No swelling, edema, deformity, signs of trauma, lacerations or bony tenderness.  Decreased range of motion. Positive left straight leg raise test. Negative right straight leg raise test. No scoliosis.       Back:  Skin:    General: Skin is warm and dry.  Neurological:     Mental Status: He is alert and oriented to person, place, and time.     Motor: No weakness.     Coordination: Coordination abnormal (standing and leaning forward favoring low back, offloads left side of back in seated position).     Gait: Gait normal.     Deep Tendon Reflexes: Reflexes normal.  Psychiatric:        Mood and Affect: Mood normal.        Behavior: Behavior normal.        Thought Content: Thought content normal.        Judgment: Judgment normal.     Assessment and Plan :   PDMP not reviewed this encounter.  1. Acute left-sided low back pain with left-sided sciatica   2. Lumbar strain, initial encounter     Suspect patient strained his back and is now elicited sciatica.  Will use prednisone, muscle relaxant, rest.  Emphasized need for back care. Rest from work. Counseled patient on potential for adverse effects with medications prescribed/recommended today, ER and return-to-clinic precautions discussed, patient verbalized understanding.    Wallis Bamberg, PA-C 06/21/20 1357

## 2020-06-21 NOTE — ED Triage Notes (Signed)
Pt presents with left side lower back pain radiates to right leg and right foot x 1 week. Pain is worse when sitting. Ibuprofen, hot showers and icy hot cream gives no relief.   Pt reports he fell over the left side 3 weeks ago.   Pt requested amlodipine 10 mg. Last dose ws 1 week ago.

## 2020-06-24 ENCOUNTER — Other Ambulatory Visit: Payer: Self-pay

## 2020-06-25 ENCOUNTER — Encounter: Payer: Self-pay | Admitting: *Deleted

## 2020-10-30 ENCOUNTER — Encounter: Payer: Self-pay | Admitting: *Deleted

## 2021-04-10 IMAGING — DX DG RIBS W/ CHEST 3+V*L*
3 series · 3 of 3 positions shown · non-contrast
Comparison: 05/13/2019

CLINICAL DATA: Fall.  Left chest pain

EXAM:
LEFT RIBS AND CHEST - 3+ VIEW

[chest pa]
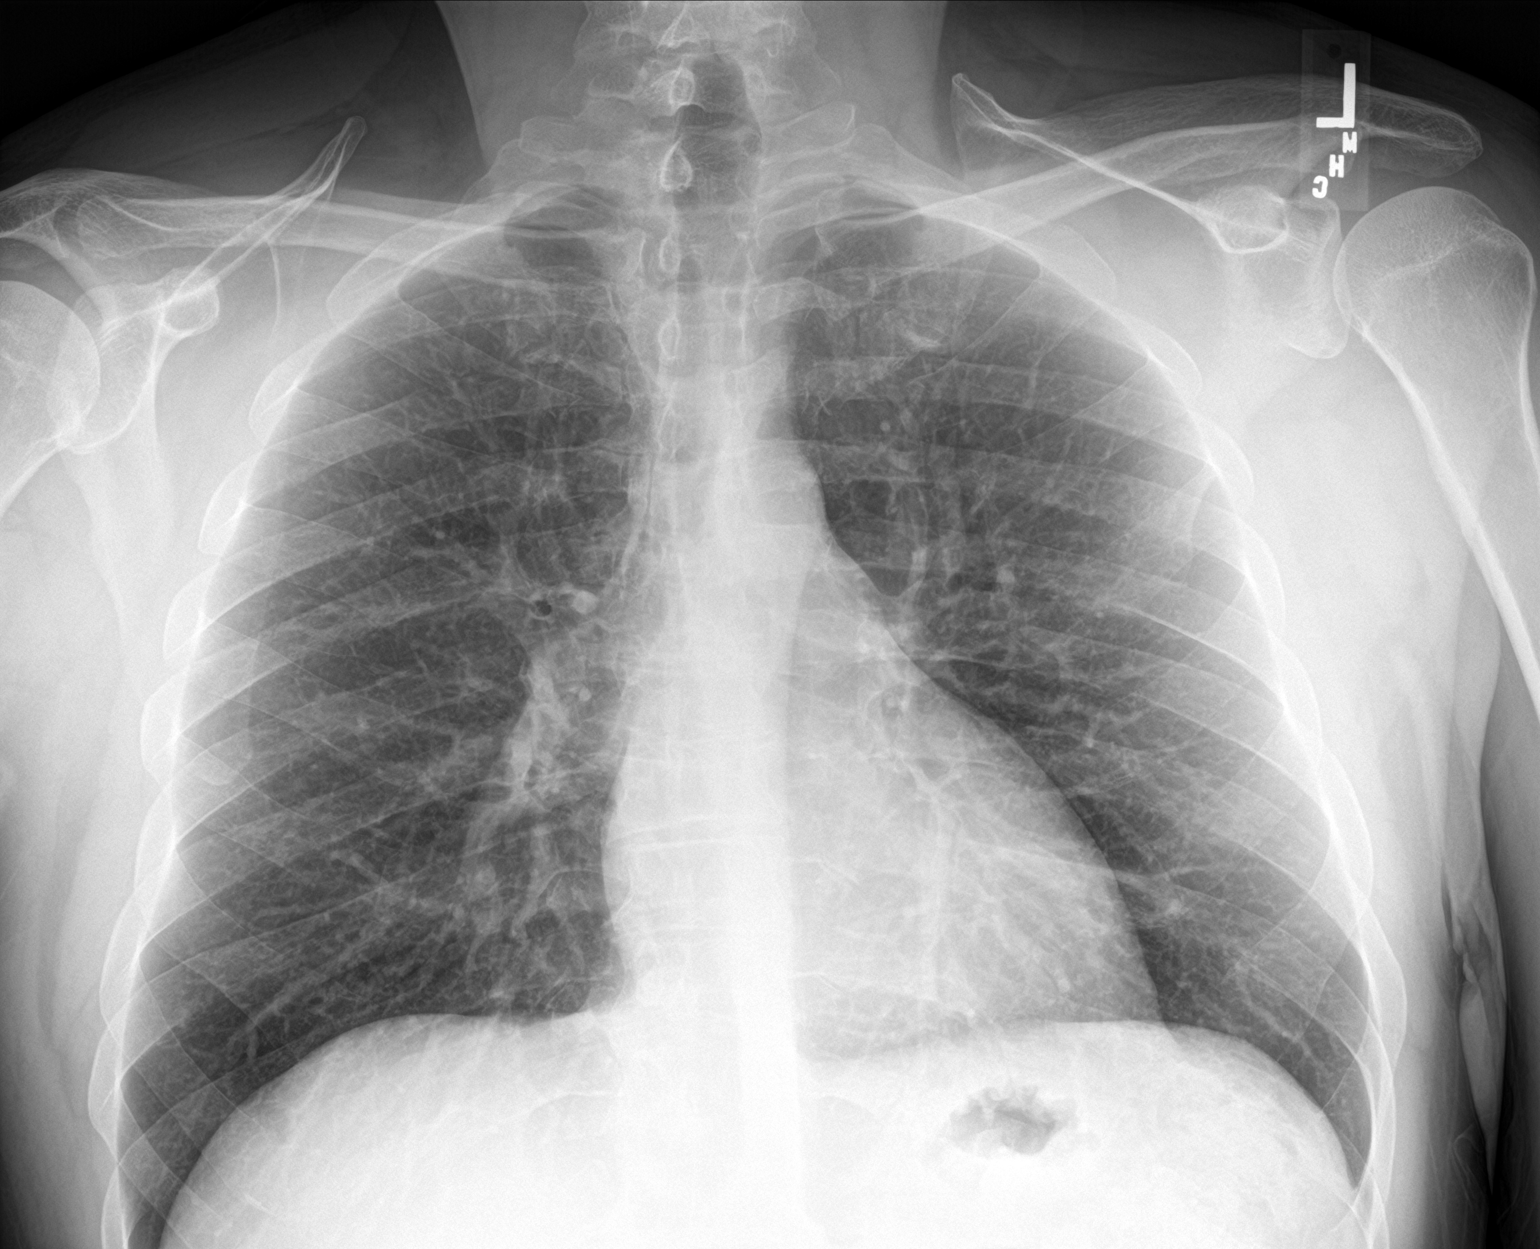

[rib obl]
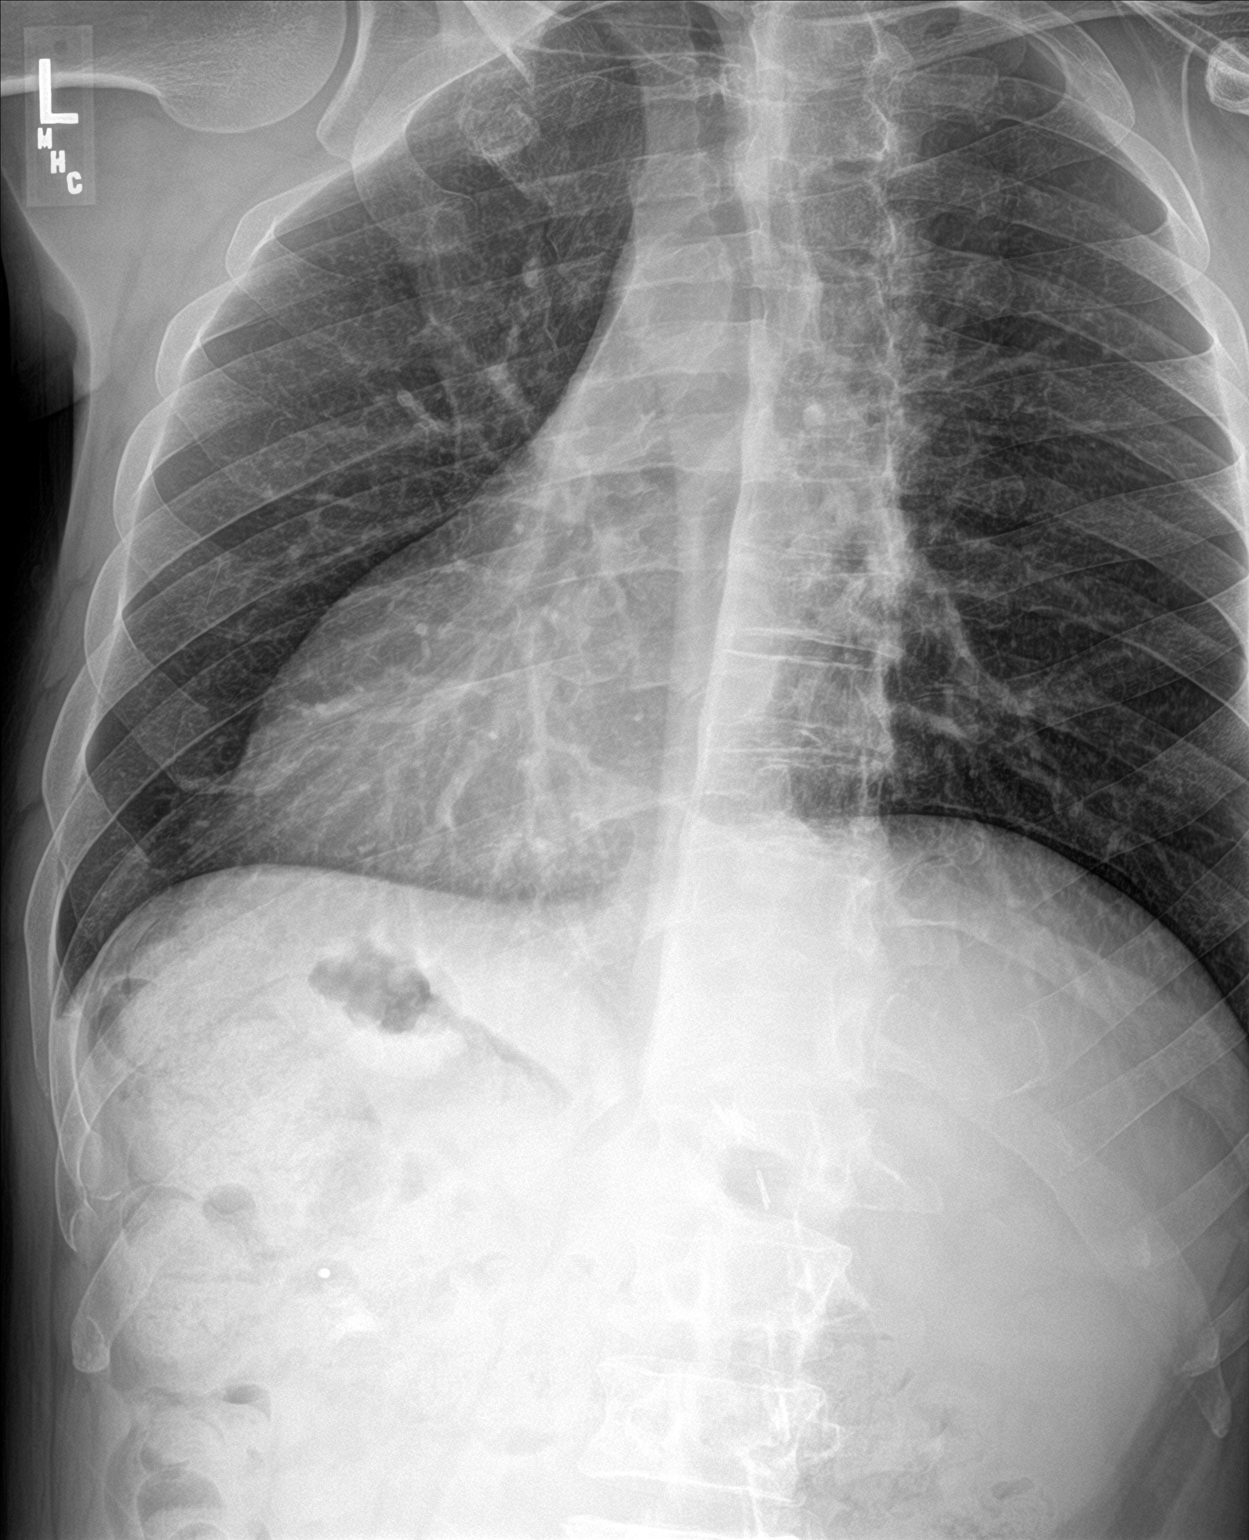

[rib pa]
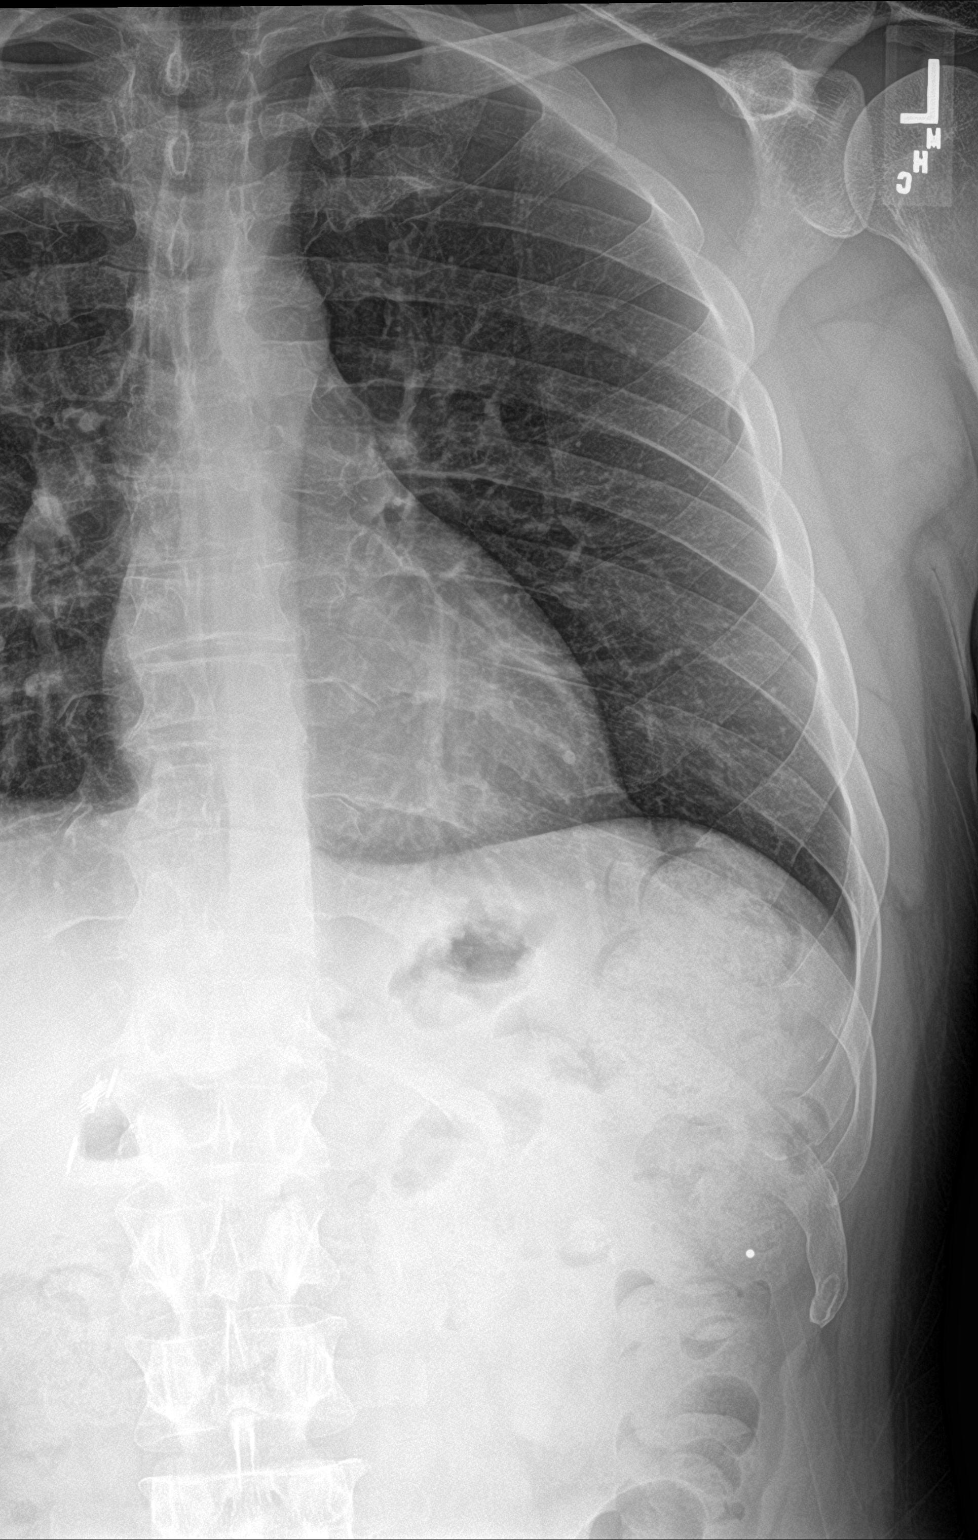

[3 of 3 positions shown; findings below may reference images not displayed]

FINDINGS: No fracture or other bone lesions are seen involving the ribs. There
is no evidence of pneumothorax or pleural effusion. Both lungs are
clear. Heart size and mediastinal contours are within normal limits.
IMPRESSION: Negative.
